# Patient Record
Sex: Female | Born: 1960 | Race: White | Hispanic: No | Marital: Single | Smoking: Never smoker
Health system: Southern US, Community
[De-identification: ages and names within clinical notes are randomized; demographics above are authoritative.]

## PROBLEM LIST (undated history)

## (undated) DIAGNOSIS — F419 Anxiety disorder, unspecified: Secondary | ICD-10-CM

---

## 2005-11-22 ENCOUNTER — Ambulatory Visit (HOSPITAL_COMMUNITY): Admission: RE | Admit: 2005-11-22 | Discharge: 2005-11-22 | Payer: Self-pay | Admitting: Obstetrics and Gynecology

## 2005-11-22 ENCOUNTER — Encounter (INDEPENDENT_AMBULATORY_CARE_PROVIDER_SITE_OTHER): Payer: Self-pay | Admitting: *Deleted

## 2006-10-12 ENCOUNTER — Ambulatory Visit: Payer: Self-pay | Admitting: Family Medicine

## 2010-09-23 NOTE — Op Note (Signed)
NAMENANETTA, WIEGMAN NO.:  [id number]   MEDICAL RECORD NO.:  [id number]          PATIENT TYPE:  AMB   LOCATION:  SDC                           FACILITY:  [facility]   PHYSICIAN:  Richardean Sale, M.D.   DATE OF BIRTH:  [birth year]   DATE OF PROCEDURE:  11/22/2005  DATE OF DISCHARGE:                                 OPERATIVE REPORT   PREOPERATIVE DIAGNOSES:  1.  Dysfunctional uterine bleeding.  2.  Suspected endometrial polyp on ultrasound.   POSTOP DIAGNOSES:  1.  Dysfunctional uterine bleeding.  2.  Endometrial polyp.   PROCEDURE:  Hysteroscopy dilation and curettage with polypectomy.   SURGEON:  Richardean Sale, M.D.   ASSISTANT:  None.   ANESTHESIA:  General.   COMPLICATIONS:  None.   ESTIMATED BLOOD LOSS:  Minimal.   FLUID DEFICIT:  10 mL.   FINDINGS:  Multiple small endometrial polyps originating from the anterior  uterine wall and the fundus.  Moderate amount of cervical stenosis.   SPECIMENS:  Endometrial polyps and curettings sent to pathology.   INDICATIONS:  This is a 45-year gravida 0 white female who has had  progressively heavier periods and some intermenstrual bleeding.  He  underwent ultrasound evaluation and was found to have a thickened  endometrium; and an area of the fundus on ultrasound which was suspicious  for a polyp.  A sonohistogram was attempted in the office, but was  unsuccessful due to cervical stenosis.  The patient, therefore, presents  today for a hysteroscopy D&C and possible removal of endometrial polyp.  Prior to the procedure the risks, benefits, and alternatives of the  procedure were reviewed with the patient in detail.  We discussed the risks  which include; but are not limited to, hemorrhage requiring transfusion,  infection, injury to the uterus such the perforation which might require  additional surgery, and possible result in hysterectomy.  Also reviewed  anesthesia related complications, and DVT.  The patient  voices understanding  of all the above and desires to proceed.  Informed consent has been  obtained.  The patient was also counseled on the option to proceed with  endometrial ablation at the time of this procedure to help control heavy  menses; and the patient has declined.   DESCRIPTION OF PROCEDURE:  The patient was taken to the operating room where  she was given a general anesthetic.  She was then prepped and draped in the  usual sterile fashion in the dorsal lithotomy position.  A red rubber  catheter was then used to drain the bladder.  A bimanual exam was performed.  The uterus was slightly anteverted, mobile with no obvious masses.  Adnexa  were normal.  A speculum was then placed in the vagina; and a single-tooth  tenaculum was used to grasp the edge of the cervix.  A paracervical block  using a total of 20 mL of 1% Dexamethasone was then administered to help  with postoperative discomfort.  The cervix which was moderately stenotic and  for which the patient had used intravaginal Cytotec the night before; was  then very gently dilated, the lacrimal duct dilators had to be used to enter  the uterine cavity.  Using very careful dilation the uterus was able to be  dilated up to the #27 Hegar dilator.   Once this was complete, the hysteroscope was then introduced.  Findings  revealed multiple endometrial polyps that were obscuring the tubal ostia.  No submucosal or intracavitary fibroids were identified.  The hysteroscope  was then removed.  Given the multiple small polyps, the polyp forceps were  used to remove these.  These were sent to pathology labeled as endometrial  polyp.  The hysteroscope was then reintroduced.  There are no further polyps  identified; and the cavity appeared normal.  The hysteroscope was then  removed in this was followed by a sharp curettage until a gritty texture was  noted in all four quadrants.  This specimen was then sent to pathology  labeled as  endometrial curettings.   At this point, the procedure was terminated.  All instruments were then  removed; and there was no bleeding coming from the tenaculum site; and there  was minimal bleeding coming from the cervix.  The patient tolerated the  procedure very well.  She was awakened from her anesthesia, taken out of  dorsal lithotomy position; and was transferred to the recovery room, awake  and in stable condition.  There are no complications.  All sponge, lap,  needle, and instrument counts were correct x2.      Richardean Sale, M.D.  Electronically Signed     JW/MEDQ  D:  11/22/2005  T:  11/22/2005  Job:  [id number]

## 2010-10-06 ENCOUNTER — Ambulatory Visit: Payer: Self-pay

## 2011-04-04 ENCOUNTER — Other Ambulatory Visit: Payer: Self-pay | Admitting: Dermatology

## 2011-09-13 ENCOUNTER — Ambulatory Visit: Payer: Self-pay | Admitting: *Deleted

## 2017-06-05 ENCOUNTER — Encounter (HOSPITAL_COMMUNITY): Payer: Self-pay | Admitting: Emergency Medicine

## 2017-06-05 ENCOUNTER — Emergency Department (HOSPITAL_COMMUNITY)
Admission: EM | Admit: 2017-06-05 | Discharge: 2017-06-06 | Disposition: A | Discharge status: Other Acute Inpatient Hospital | Payer: BLUE CROSS/BLUE SHIELD | Attending: Emergency Medicine | Admitting: Emergency Medicine

## 2017-06-05 DIAGNOSIS — F312 Bipolar disorder, current episode manic severe with psychotic features: Secondary | ICD-10-CM | POA: Diagnosis not present

## 2017-06-05 DIAGNOSIS — F22 Delusional disorders: Secondary | ICD-10-CM | POA: Insufficient documentation

## 2017-06-05 DIAGNOSIS — F309 Manic episode, unspecified: Secondary | ICD-10-CM | POA: Insufficient documentation

## 2017-06-05 DIAGNOSIS — Z79899 Other long term (current) drug therapy: Secondary | ICD-10-CM | POA: Diagnosis not present

## 2017-06-05 DIAGNOSIS — R45851 Suicidal ideations: Secondary | ICD-10-CM | POA: Insufficient documentation

## 2017-06-05 DIAGNOSIS — G4700 Insomnia, unspecified: Secondary | ICD-10-CM | POA: Diagnosis not present

## 2017-06-05 LAB — CBC WITH DIFFERENTIAL/PLATELET
BASOS PCT: 0 %
Basophils Absolute: 0 10*3/uL (ref 0.0–0.1)
EOS ABS: 0 10*3/uL (ref 0.0–0.7)
EOS PCT: 0 %
HCT: 42.4 % (ref 36.0–46.0)
Hemoglobin: 15.1 g/dL — ABNORMAL HIGH (ref 12.0–15.0)
LYMPHS ABS: 1.3 10*3/uL (ref 0.7–4.0)
Lymphocytes Relative: 17 %
MCH: 32.3 pg (ref 26.0–34.0)
MCHC: 35.6 g/dL (ref 30.0–36.0)
MCV: 90.8 fL (ref 78.0–100.0)
Monocytes Absolute: 0.6 10*3/uL (ref 0.1–1.0)
Monocytes Relative: 7 %
NEUTROS PCT: 76 %
Neutro Abs: 5.6 10*3/uL (ref 1.7–7.7)
PLATELETS: 254 10*3/uL (ref 150–400)
RBC: 4.67 MIL/uL (ref 3.87–5.11)
RDW: 12.4 % (ref 11.5–15.5)
WBC: 7.5 10*3/uL (ref 4.0–10.5)

## 2017-06-05 LAB — COMPREHENSIVE METABOLIC PANEL
ALBUMIN: 4.4 g/dL (ref 3.5–5.0)
ALT: 24 U/L (ref 14–54)
AST: 30 U/L (ref 15–41)
Alkaline Phosphatase: 49 U/L (ref 38–126)
Anion gap: 8 (ref 5–15)
BUN: 7 mg/dL (ref 6–20)
CHLORIDE: 104 mmol/L (ref 101–111)
CO2: 26 mmol/L (ref 22–32)
CREATININE: 0.66 mg/dL (ref 0.44–1.00)
Calcium: 9.5 mg/dL (ref 8.9–10.3)
GFR calc Af Amer: >60 mL/min (ref >60)
GFR calc non Af Amer: >60 mL/min (ref >60)
GLUCOSE: 127 mg/dL — AB (ref 65–99)
POTASSIUM: 3.7 mmol/L (ref 3.5–5.1)
SODIUM: 138 mmol/L (ref 135–145)
Total Bilirubin: 1.1 mg/dL (ref 0.3–1.2)
Total Protein: 7.7 g/dL (ref 6.5–8.1)

## 2017-06-05 LAB — I-STAT BETA HCG BLOOD, ED (MC, WL, AP ONLY): I-stat hCG, quantitative: 11.3 m[IU]/mL — ABNORMAL HIGH (ref <5)

## 2017-06-05 LAB — RAPID URINE DRUG SCREEN, HOSP PERFORMED
AMPHETAMINES: NOT DETECTED
BENZODIAZEPINES: NOT DETECTED
Barbiturates: NOT DETECTED
Cocaine: NOT DETECTED
Opiates: NOT DETECTED
TETRAHYDROCANNABINOL: NOT DETECTED

## 2017-06-05 LAB — ETHANOL: Alcohol, Ethyl (B): <10 mg/dL (ref <10)

## 2017-06-05 LAB — POC URINE PREG, ED: Preg Test, Ur: NEGATIVE

## 2017-06-05 MED ORDER — MAGNESIUM CHLORIDE 64 MG PO TBEC
1.0000 | DELAYED_RELEASE_TABLET | Freq: Two times a day (BID) | ORAL | Status: DC
  Administered 2017-06-05 – 2017-06-06 (×2): 64 mg via ORAL
  Filled 2017-06-05 (×3): qty 1

## 2017-06-05 MED ORDER — VITAMIN A 10000 UNITS PO CAPS
10000.0000 [IU] | ORAL_CAPSULE | Freq: Every day | ORAL | Status: DC
  Administered 2017-06-05 – 2017-06-06 (×2): 10000 [IU] via ORAL
  Filled 2017-06-05 (×2): qty 1

## 2017-06-05 MED ORDER — OLANZAPINE 2.5 MG PO TABS
2.5000 mg | ORAL_TABLET | Freq: Every day | ORAL | Status: DC
  Administered 2017-06-05: 2.5 mg via ORAL
  Filled 2017-06-05: qty 1

## 2017-06-05 MED ORDER — BENZONATATE 100 MG PO CAPS
100.0000 mg | ORAL_CAPSULE | Freq: Once | ORAL | Status: AC
  Administered 2017-06-05: 100 mg via ORAL
  Filled 2017-06-05: qty 1

## 2017-06-05 NOTE — ED Notes (Signed)
Bed: WTR5 Expected date:  Expected time:  Means of arrival:  Comments: 
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
## 2017-06-05 NOTE — ED Notes (Signed)
Pt A&O x 3, no distress noted, calm & cooperative.  Visiting with family at present.  Monitoring for safety, Q 15 min checks in effect.

## 2017-06-05 NOTE — ED Notes (Signed)
IVC paperwork states," Pt paranoid, states she's "being set up." Carrying illegal firearms per S.O. Family found knife in belongings. Pt has made statements she "wants to hang herself." Pt having rapid, pressured speech. Inpt treatment recommended.

## 2017-06-05 NOTE — ED Notes (Signed)
[facility] at this time. Patient calm/ cooperative at this time. IVC paperwork with Diplomatic Services operational officersecretary. Plan of care reviewed with patient and patient's significant other. Will continue to monitor patient until patient can be placed in psychiatric area.
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
## 2017-06-05 NOTE — ED Triage Notes (Signed)
EDP at bedside. Pt ambulated to bathroom with supervision. No balance issues noted. Pt cooperative

## 2017-06-05 NOTE — ED Notes (Signed)
Bed: [facility] Expected date:  Expected time:  Means of arrival:  Comments: 

## 2017-06-05 NOTE — ED Triage Notes (Signed)
Per pt, states she has bouts of emotional stress/issues-states she saw a [place] in [place] a few weeks ago-was placed on Zyprexa-states she saw her again yesterday and was told she wasn't paranoid or delusional-patient is hyper verbal, speech pressured-mother is with her-states she is not suicidal or homicidal-states she needs to book a flight and she is ok

## 2017-06-05 NOTE — ED Notes (Signed)
Report given to NIGHT RN
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
## 2017-06-05 NOTE — ED Notes (Signed)
Patient provided with oral fluids and meal. No other acute needs identified.

## 2017-06-06 ENCOUNTER — Other Ambulatory Visit: Payer: Self-pay

## 2017-06-06 ENCOUNTER — Encounter (HOSPITAL_COMMUNITY): Payer: Self-pay | Admitting: *Deleted

## 2017-06-06 ENCOUNTER — Inpatient Hospital Stay (HOSPITAL_COMMUNITY)
Admission: AD | Admit: 2017-06-06 | Discharge: 2017-06-08 | DRG: 885 | Disposition: A | Discharge status: Home / Self Care | Payer: BLUE CROSS/BLUE SHIELD | Attending: Psychiatry | Admitting: Psychiatry

## 2017-06-06 DIAGNOSIS — F312 Bipolar disorder, current episode manic severe with psychotic features: Principal | ICD-10-CM | POA: Diagnosis present

## 2017-06-06 DIAGNOSIS — Z79899 Other long term (current) drug therapy: Secondary | ICD-10-CM

## 2017-06-06 DIAGNOSIS — F4310 Post-traumatic stress disorder, unspecified: Secondary | ICD-10-CM | POA: Diagnosis present

## 2017-06-06 DIAGNOSIS — R45851 Suicidal ideations: Secondary | ICD-10-CM | POA: Diagnosis not present

## 2017-06-06 DIAGNOSIS — R450 Nervousness: Secondary | ICD-10-CM | POA: Diagnosis not present

## 2017-06-06 DIAGNOSIS — Z6379 Other stressful life events affecting family and household: Secondary | ICD-10-CM | POA: Diagnosis not present

## 2017-06-06 DIAGNOSIS — Z818 Family history of other mental and behavioral disorders: Secondary | ICD-10-CM | POA: Diagnosis not present

## 2017-06-06 DIAGNOSIS — F419 Anxiety disorder, unspecified: Secondary | ICD-10-CM | POA: Diagnosis not present

## 2017-06-06 DIAGNOSIS — Z811 Family history of alcohol abuse and dependence: Secondary | ICD-10-CM | POA: Diagnosis not present

## 2017-06-06 DIAGNOSIS — G4700 Insomnia, unspecified: Secondary | ICD-10-CM | POA: Diagnosis present

## 2017-06-06 DIAGNOSIS — F1910 Other psychoactive substance abuse, uncomplicated: Secondary | ICD-10-CM | POA: Diagnosis not present

## 2017-06-06 DIAGNOSIS — F22 Delusional disorders: Secondary | ICD-10-CM | POA: Diagnosis not present

## 2017-06-06 HISTORY — DX: Anxiety disorder, unspecified: F41.9

## 2017-06-06 MED ORDER — ALUM & MAG HYDROXIDE-SIMETH 200-200-20 MG/5ML PO SUSP: 30.0000 mL | ORAL | Status: DC | PRN

## 2017-06-06 MED ORDER — TRAZODONE HCL 50 MG PO TABS
50.0000 mg | ORAL_TABLET | Freq: Every evening | ORAL | Status: DC | PRN
  Filled 2017-06-06: qty 1

## 2017-06-06 MED ORDER — OLANZAPINE 5 MG PO TABS: 5.0000 mg | ORAL_TABLET | Freq: Every day | ORAL | Status: DC

## 2017-06-06 MED ORDER — MAGNESIUM HYDROXIDE 400 MG/5ML PO SUSP
30.0000 mL | Freq: Every day | ORAL | Status: DC | PRN
Start: 2017-06-06 — End: 2017-06-08

## 2017-06-06 MED ORDER — OLANZAPINE 5 MG PO TABS
5.0000 mg | ORAL_TABLET | Freq: Every day | ORAL | Status: DC
  Administered 2017-06-06 – 2017-06-07 (×2): 5 mg via ORAL
  Filled 2017-06-06: qty 2
  Filled 2017-06-06 (×4): qty 1

## 2017-06-06 MED ORDER — ACETAMINOPHEN 325 MG PO TABS: 650.0000 mg | ORAL_TABLET | Freq: Four times a day (QID) | ORAL | Status: DC | PRN

## 2017-06-06 MED ORDER — VITAMIN A 10000 UNITS PO CAPS
10000.0000 [IU] | ORAL_CAPSULE | Freq: Every day | ORAL | Status: DC
  Administered 2017-06-07 – 2017-06-08 (×2): 10000 [IU] via ORAL
  Filled 2017-06-06 (×4): qty 1

## 2017-06-06 MED ORDER — HYDROXYZINE HCL 25 MG PO TABS
25.0000 mg | ORAL_TABLET | Freq: Three times a day (TID) | ORAL | Status: DC | PRN
  Administered 2017-06-07: 25 mg via ORAL
  Filled 2017-06-06: qty 1

## 2017-06-06 NOTE — Plan of Care (Signed)
  Safety: Periods of time without injury will increase 06/06/2017 2324 - Progressing by Delos HaringPhillips, Gracey Tolle A, RN Note Pt safe on the unit at this time

## 2017-06-06 NOTE — Tx Team (Signed)
Initial Treatment Plan 06/06/2017 6:55 PM Suzanne Frye ZOX:[id number]    PATIENT STRESSORS: Medication change or noncompliance Occupational concerns   PATIENT STRENGTHS: Ability for insight Average or above average intelligence Capable of independent living Communication skills General fund of knowledge Motivation for treatment/growth Supportive family/friends   PATIENT IDENTIFIED PROBLEMS: Anxiety Coping skills "I've been having a lot of anxiety"                     DISCHARGE CRITERIA:  Ability to meet basic life and health needs Improved stabilization in mood, thinking, and/or behavior Verbal commitment to aftercare and medication compliance  PRELIMINARY DISCHARGE PLAN: Attend aftercare/continuing care group Return to previous living arrangement  PATIENT/FAMILY INVOLVEMENT: This treatment plan has been presented to and reviewed with the patient, Suzanne Frye, and/or family member, .  The patient and family have been given the opportunity to ask questions and make suggestions.  Suzanne Frye, Suzanne Frye, [place]RN 06/06/2017, 6:55 PM

## 2017-06-06 NOTE — ED Notes (Addendum)
Pt transported to [facility] by [employer]. All belongings returned to pt who signed for same. Pt remained calm and cooperative.  

## 2017-06-06 NOTE — Progress Notes (Signed)
D: Pt denies SI/HI/AVH. Pt is pleasant and cooperative. Pt presents hyper- verbal. Pt stated she got here due to anxiety and job stress. Pt seemed to have some insight into Tx, but may have had a breakdown in her coping mechanisms. Pt has been visible on the unit and has been appropriate with peers/ staff.   A: Pt was offered support and encouragement. Pt was given scheduled medications. Pt was encourage to attend groups. Q 15 minute checks were done for safety.   R:Pt attends groups and interacts well with peers and staff. Pt is taking medication. Pt has no complaints.Pt receptive to treatment and safety maintained on unit.

## 2017-06-06 NOTE — BH Assessment (Addendum)
[facility]  Per Juanetta BeetsJacqueline Norman, DO, this pt requires psychiatric hospitalization.  Malva LimesLinsey Strader, RN, [facility] in anticipation of a discharge scheduled for later today; she will call when [facility].  Pt presents under IVC initiated by EDP Samuel JesterKathleen McManus, MD,  and [facility] documents [facility].  Pt's nurse, Diane, has been notified, and agrees to call report to [phone].  Pt is to be transported via Patent examinerlaw enforcement when the time comes.   Doylene Canninghomas Abcde Oneil, [place] Behavioral Health Coordinator [phone]   Addendum:  Per Richelle ItoLinsey, [facility] pt at 16:00.  Diane has been notified.  Doylene Canninghomas Chaston Bradburn, [place] Behavioral Health Coordinator [phone]

## 2017-06-06 NOTE — Consult Note (Signed)
[facility] Face-to-Face Psychiatry Consult   Reason for Consult:  SI and manic symptoms Referring Physician:  EDP Patient Identification: Suzanne Frye MRN:  [id number] Principal Diagnosis: Bipolar I disorder, current or most recent episode manic, with psychotic features ([place]) Diagnosis:  There are no active problems to display for this patient.   Total Time spent with patient: 45 minutes  Subjective:   Suzanne Frye is a 57 y.o. female patient admitted with manic symptoms and SI.  HPI:   [name] reports that she came to the ED to get some medication to help slow her down but she is supposed to be in [place] for a business trip. She denies SI and "swears on the bible" which she has been reading today. She reports that last summer she felt paranoid due to a specific job she was completing. She thought the phone was tapped and someone was stalking her. She attributes some of these feelings to dealing with "trauma" of feeling pushed out of her church. She reports going target shooting for protection because she felt someone had broken into her house. She reports "mouthing off" at her mother that she should shoot herself. She denies intention to harm self and reports saying this in the setting of Xanax and alcohol use. She took her friend's Xanax for 4 days to help with sleep. She reports that her outpatient psychiatrist has been adjusting her medications. She was recently taking Risperdal but it was switched to Zyprexa a week ago. She feels like Risperdal caused racing thoughts. She continues to have poor sleep.   Past Psychiatric History: Bipolar disorder  Risk to Self: Suicidal Ideation: No-Not Currently/Within Last 6 Months Suicidal Intent: No-Not Currently/Within Last 6 Months Is patient at risk for suicide?: No Suicidal Plan?: No-Not Currently/Within Last 6 Months Access to Means: Yes Specify Access to Suicidal Means: pt had a gun and a knife What has been your use of drugs/alcohol within the  last 12 months?: using acohol and xanex How many times?: 0 Intentional Self Injurious Behavior: None Risk to Others: Homicidal Ideation: No Thoughts of Harm to Others: No Current Homicidal Intent: No Current Homicidal Plan: No Access to Homicidal Means: No Identified Victim: None History of harm to others?: No Assessment of Violence: None Noted Violent Behavior Description: none Does patient have access to weapons?: No Criminal Charges Pending?: No Does patient have a court date: No Prior Inpatient Therapy: Prior Inpatient Therapy: No Prior Outpatient Therapy: Prior Outpatient Therapy: Yes Prior Therapy Facilty/Provider(s): Dr. Tera Mater  Reason for Treatment: med management  Does patient have an ACCT team?: No Does patient have Intensive In-House Services?  : No Does patient have [facility] services? : No Does patient have P4CC services?: No  Past Medical History: History reviewed. No pertinent past medical history. The histories are not reviewed yet. Please review them in the "History" navigator section and refresh this [place]. Family History: No family history on file. Family Psychiatric  History: She reports that her mother had a "mental breakdown" when the patient's uncle passed.  Social History:  Social History   Substance and Sexual Activity  Alcohol Use Yes     Social History   Substance and Sexual Activity  Drug Use Not on file    Social History   Socioeconomic History  . Marital status: Single    Spouse name: None  . Number of children: None  . Years of education: None  . Highest education level: None  Social Needs  . Financial resource strain:  None  . Food insecurity - worry: None  . Food insecurity - inability: None  . Transportation needs - medical: None  . Transportation needs - non-medical: None  Occupational History  . None  Tobacco Use  . Smoking status: None  Substance and Sexual Activity  . Alcohol use: Yes  . Drug use: None  . Sexual  activity: None  Other Topics Concern  . None  Social History Narrative  . None   Additional Social History: She has a house in [place] with her partner but she stays with her mom in [place] as well. 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 She reports social alcohol use. She denies illicit substance use but does reports taking her friend's Xanax for 4 days to help with sleep.     Allergies:  Not on File  Labs:  Results for orders placed or performed during the hospital encounter of 06/05/17 (from the past 48 hour(s))  Comprehensive metabolic panel     Status: Abnormal   Collection Time: 06/05/17 10:47 AM  Result Value Ref Range   Sodium 138 135 - 145 mmol/L   Potassium 3.7 3.5 - 5.1 mmol/L   Chloride 104 101 - 111 mmol/L   CO2 26 22 - 32 mmol/L   Glucose, Bld 127 (H) 65 - 99 mg/dL   BUN 7 6 - 20 mg/dL   Creatinine, Ser 0.66 0.44 - 1.00 mg/dL   Calcium 9.5 8.9 - 10.3 mg/dL   Total Protein 7.7 6.5 - 8.1 g/dL   Albumin 4.4 3.5 - 5.0 g/dL   AST 30 15 - 41 U/L   ALT 24 14 - 54 U/L   Alkaline Phosphatase 49 38 - 126 U/L   Total Bilirubin 1.1 0.3 - 1.2 mg/dL   GFR calc non Af Amer >60 >60 mL/min   GFR calc Af Amer >60 >60 mL/min    Comment: (NOTE) The eGFR has been calculated using the CKD EPI equation. This calculation has not been validated in all clinical situations. eGFR's persistently <60 mL/min signify possible Chronic Kidney Disease.    Anion gap 8 5 - 15  CBC with Diff     Status: Abnormal   Collection Time: 06/05/17 10:47 AM  Result Value Ref Range   WBC 7.5 4.0 - 10.5 K/uL   RBC 4.67 3.87 - 5.11 MIL/uL   Hemoglobin 15.1 (H) 12.0 - 15.0 g/dL   HCT 42.4 36.0 - 46.0 %   MCV 90.8 78.0 - 100.0 fL   MCH 32.3 26.0 - 34.0 pg   MCHC 35.6 30.0 - 36.0 g/dL   RDW 12.4 11.5 - 15.5 %   Platelets 254 150 - 400 K/uL   Neutrophils Relative % 76 %   Neutro Abs 5.6 1.7 - 7.7 K/uL   Lymphocytes Relative 17 %   Lymphs Abs 1.3 0.7 - 4.0 K/uL   Monocytes  Relative 7 %   Monocytes Absolute 0.6 0.1 - 1.0 K/uL   Eosinophils Relative 0 %   Eosinophils Absolute 0.0 0.0 - 0.7 K/uL   Basophils Relative 0 %   Basophils Absolute 0.0 0.0 - 0.1 K/uL  Ethanol     Status: None   Collection Time: 06/05/17 10:48 AM  Result Value Ref Range   Alcohol, Ethyl (B) <10 <10 mg/dL    Comment:        LOWEST DETECTABLE LIMIT FOR SERUM ALCOHOL IS 10 mg/dL FOR MEDICAL PURPOSES ONLY   I-Stat beta hCG blood, ED  Status: Abnormal   Collection Time: 06/05/17 11:01 AM  Result Value Ref Range   I-stat hCG, quantitative 11.3 (H) <5 mIU/mL   Comment 3            Comment:   GEST. AGE      CONC.  (mIU/mL)   <=1 WEEK        5 - 50     2 WEEKS       50 - 500     3 WEEKS       100 - 10,000     4 WEEKS     1,000 - 30,000        FEMALE AND NON-PREGNANT FEMALE:     LESS THAN 5 mIU/mL   Urine rapid drug screen (hosp performed)     Status: None   Collection Time: 06/05/17 11:26 AM  Result Value Ref Range   Opiates NONE DETECTED NONE DETECTED   Cocaine NONE DETECTED NONE DETECTED   Benzodiazepines NONE DETECTED NONE DETECTED   Amphetamines NONE DETECTED NONE DETECTED   Tetrahydrocannabinol NONE DETECTED NONE DETECTED   Barbiturates NONE DETECTED NONE DETECTED    Comment: (NOTE) DRUG SCREEN FOR MEDICAL PURPOSES ONLY.  IF CONFIRMATION IS NEEDED FOR ANY PURPOSE, NOTIFY LAB WITHIN 5 DAYS. LOWEST DETECTABLE LIMITS FOR URINE DRUG SCREEN Drug Class                     Cutoff (ng/mL) Amphetamine and metabolites    1000 Barbiturate and metabolites    200 Benzodiazepine                 588 Tricyclics and metabolites     300 Opiates and metabolites        300 Cocaine and metabolites        300 THC                            50   POC Urine Pregnancy, ED (do NOT order at [facility])     Status: None   Collection Time: 06/05/17 11:34 AM  Result Value Ref Range   Preg Test, Ur NEGATIVE NEGATIVE    Comment:        THE SENSITIVITY OF THIS METHODOLOGY IS >24 mIU/mL     No  current facility-administered medications for this encounter.    No current outpatient medications on file.    Musculoskeletal: Strength & Muscle Tone: within normal limits Gait & Station: normal Patient leans: N/A  Psychiatric Specialty Exam: Physical Exam  Nursing note and vitals reviewed. Constitutional: She is oriented to person, place, and time. She appears well-developed and well-nourished.  HENT:  Head: Normocephalic and atraumatic.  Neck: Normal range of motion.  Respiratory: Effort normal.  Musculoskeletal: Normal range of motion.  Neurological: She is alert and oriented to person, place, and time.  Skin: No rash noted.  Psychiatric: Her affect is labile. Her speech is rapid and/or pressured. She is agitated. Thought content is paranoid and delusional. Cognition and memory are normal. She expresses impulsivity.    Review of Systems  Psychiatric/Behavioral: Negative for depression, hallucinations, substance abuse and suicidal ideas. The patient has insomnia. The patient is not nervous/anxious.   All other systems reviewed and are negative.   Blood pressure 131/70, pulse 93, temperature 99.1 F (37.3 C), temperature source Oral, resp. rate 17, height '5\' 3"'$  (1.6 m), weight 65.8 kg (145 lb), SpO2 99 %.Body mass index is 25.69  kg/m.  General Appearance: Well Groomed, middle aged, Caucasian female who is wearing paper hospital scrubs and sitting in her chair while rocking back and forth. NAD.   Eye Contact:  Good  Speech:  Clear and Coherent with rapid speech.   Volume:  Normal  Mood:  Euphoric  Affect:  Labile  Thought Process:  Goal Directed and Linear  Orientation:  Full (Time, Place, and Person)  Thought Content:  Delusions and Paranoid Ideation  Suicidal Thoughts:  No  Homicidal Thoughts:  No  Memory:  Immediate;   Good Recent;   Good Remote;   Good  Judgement:  Poor  Insight:  Fair  Psychomotor Activity:  Increased and noted to pace hallway and rock back and  forth in her chair.   Concentration:  Concentration: Good and Attention Span: Good  Recall:  Good  Fund of Knowledge:  Fair  Language:  Good  Akathisia:  No  Handed:  Right  AIMS (if indicated):   N/A  Assets:  Agricultural consultant Housing Intimacy Social Support  ADL's:  Intact  Cognition:  WNL  Sleep:   Poor   Assessment:  CHERIDAN KIBLER is a 57 y.o. female who was admitted due to concerns of her family and partner for manic symptoms and unsafe behaviors and concern for SI. She is labile in affect and endorses delusional as well as paranoid thoughts on interview. Her speech is rapid and she reports poor sleep for several nights. Family report that she made statements about wanting to end her life and engaged in dangerous behaviors (target shooting with a gun as well as endorsing thoughts to hang self. She warrants inpatient psychiatric hospitalization for stabilization and treatment.   Treatment Plan Summary: Daily contact with patient to assess and evaluate symptoms and progress in treatment and Medication management  -Increase Zyprexa 2.5 mg qhs to 5 mg qhs for manic symptoms and insomnia.  -Patient accepted to [facility] today.   Disposition: Recommend psychiatric Inpatient admission when medically cleared.  Faythe Dingwall, DO 06/06/2017 6:05 PM

## 2017-06-06 NOTE — Progress Notes (Signed)
Suzanne Frye is a 57 year old female pt admitted on involuntary basis. On admission, she does endorse anxiety but denies any SI and is able to contract for safety while in the hospital. She reports having some occupational concerns and spoke about how she should have been able to handle them but hasn't been able to do so. She spoke about being on a small dose of medication that she was put on and then spoke about how she did take some xanax and used alcohol for 4 days to help alleviate her anxiety. 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 She reports that she lives with her partner and reports that she will go back there upon discharge. Suzanne Frye was oriented to the unit and safety maintained.

## 2017-06-07 DIAGNOSIS — F419 Anxiety disorder, unspecified: Secondary | ICD-10-CM

## 2017-06-07 DIAGNOSIS — F312 Bipolar disorder, current episode manic severe with psychotic features: Principal | ICD-10-CM

## 2017-06-07 DIAGNOSIS — R450 Nervousness: Secondary | ICD-10-CM

## 2017-06-07 DIAGNOSIS — Z811 Family history of alcohol abuse and dependence: Secondary | ICD-10-CM

## 2017-06-07 DIAGNOSIS — R45851 Suicidal ideations: Secondary | ICD-10-CM

## 2017-06-07 DIAGNOSIS — Z6379 Other stressful life events affecting family and household: Secondary | ICD-10-CM

## 2017-06-07 DIAGNOSIS — Z818 Family history of other mental and behavioral disorders: Secondary | ICD-10-CM

## 2017-06-07 DIAGNOSIS — F1910 Other psychoactive substance abuse, uncomplicated: Secondary | ICD-10-CM

## 2017-06-07 DIAGNOSIS — F4310 Post-traumatic stress disorder, unspecified: Secondary | ICD-10-CM

## 2017-06-07 MED ORDER — IBUPROFEN 600 MG PO TABS
600.0000 mg | ORAL_TABLET | Freq: Four times a day (QID) | ORAL | Status: DC | PRN
  Administered 2017-06-07 (×2): 600 mg via ORAL
  Filled 2017-06-07 (×2): qty 1

## 2017-06-07 NOTE — Progress Notes (Addendum)
D: Pt denies SI/HI/AVh. Pt is pleasant and cooperative. Pt stated she had a great day, "I want to talk to the doctor , there's a lot I haven't told him, I wake up with a lot of anxiety" pt was encouraged to use the Vistaril due to it being prescribed for the anxiety.   A: Pt was offered support and encouragement. Pt was given scheduled medications. Pt was encourage to attend groups. Q 15 minute checks were done for safety.   R:Pt attends groups and interacts well with peers and staff. Pt is taking medication. Pt has no complaints.Pt receptive to treatment and safety maintained on unit.

## 2017-06-07 NOTE — [facility] Suicide Risk Assessment (Signed)
[facility] INPATIENT:  Family/Significant Other Suicide Prevention Education  Suicide Prevention Education:  Education Completed; Suzanne Frye [phone] (Mother)  has been identified by the patient as the family member/significant other with whom the patient will be residing, and identified as the person(s) who will aid the patient in the event of a mental health crisis (suicidal ideations/suicide attempt).  With written consent from the patient, the family member/significant other has been provided the following suicide prevention education, prior to the and/or following the discharge of the patient.  The suicide prevention education provided includes the following:  Suicide risk factors  Suicide prevention and interventions  National Suicide Hotline telephone number  [facility] assessment telephone number  [facility] Emergency Assistance 911  [facility] and/or Residential Mobile Crisis Unit telephone number  Request made of family/significant other to:  Remove weapons (e.g., guns, rifles, knives), all items previously/currently identified as safety concern.    Remove drugs/medications (over-the-counter, prescriptions, illicit drugs), all items previously/currently identified as a safety concern.  The family member/significant other verbalizes understanding of the suicide prevention education information provided.  The family member/significant other agrees to remove the items of safety concern listed above.   Suzanne Frye states that this behavior began in June of 2018 with paranoia.  The behavior decreased until January of 2019 when the family discovered Suzanne Frye was taking Xanax and her psychiatric medications with wine.  It was stated that at this time Suzanne Frye began expressing passive SI and had very little sleep during a two week period.  The family brought her to the hospital in hopes that she will stop drinking while taking her medications.  Her mother does not feel  like she needs to be on 500 hall and would like to see her released soon.   Suzanne Frye 06/07/2017, 9:47 AM

## 2017-06-07 NOTE — Plan of Care (Signed)
  Safety: Periods of time without injury will increase 06/07/2017 2208 - Progressing by Delos HaringPhillips, Dijuan Sleeth A, RN Note Pt safe on the unit at this time    Self-Concept: Level of anxiety will decrease 06/07/2017 2208 - Progressing by Delos HaringPhillips, Brailynn Breth A, RN Note Pt stated she was less anxious and pt appeared less anxious compared to last night

## 2017-06-07 NOTE — Progress Notes (Signed)
pt stated the 5 mg of Zyprexa was too much, she probably would want to split it up

## 2017-06-07 NOTE — Progress Notes (Signed)
[facility] Group Notes:  (Nursing/MHT/Case Management/Adjunct)  Date:  06/07/2017  Time:  4:35 PM  Type of Therapy:  Nurse Education  Participation Level:  Active  Participation Quality:  Appropriate  Affect:  Anxious  Cognitive:  Alert and Oriented  Insight:  Appropriate  Engagement in Group:  Engaged  Modes of Intervention:  Activity, Discussion, Education, Socialization and Support  Summary of Progress/Problems:The purpose of this group is to use guided imagery and support patients in discussing coping skills for relaxation.   Beatrix ShipperWright, Kambra Beachem Martin 06/07/2017, 4:35 PM

## 2017-06-07 NOTE — Progress Notes (Signed)
Recreation Therapy Notes  Date: 06/07/17 Time: 1000 Location: 500 Hall Dayroom  Group Topic: Self-Esteem  Goal Area(s) Addresses:  Patient will successfully identify positive attributes about themselves.  Patient will successfully identify benefit of improved self-esteem.   Behavioral Response: Engaged  Intervention: Scientist, clinical (histocompatibility and immunogenetics)Construction paper, markers, colored pencils  Activity: Brochure About Me:  Patients were to create a brochure to highlight the things that make them unique and set them apart from everyone else.  Patients could highlight things such as biggest accomplishment, favorite feature, important dates, best quality, etc.  Education:  Self-Esteem, Building control surveyorDischarge Planning.   Education Outcome: Acknowledges education/In group clarification offered/Needs additional education  Clinical Observations/Feedback: Pt was pleasant, appropriate but talked really fast.  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  Pt also stated she was a daughter, sister and "doggie mom".  Pt expressed that she was spiritual, devoted and happy.   Caroll RancherMarjette Odelle Kosier, LRT/CTRS      Caroll RancherLindsay, Berlie Hatchel A 06/07/2017 12:26 PM

## 2017-06-07 NOTE — B[facility] Counselor (Signed)
Adult Comprehensive Assessment  Patient ID: Suzanne Frye, female   DOB: 09/06/1960, 57 y.o.   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 and with her mother and [place] in [place] during the week  Physical health (include injuries & life threatening diseases): N/A Social relationships: N/A Substance abuse: Pt reports drinking 2 glasses of wine 5 days a week  Bereavement / Loss: N/A  Living/Environment/Situation:  Living Arrangements: Spouse/significant other, Parent Living conditions (as described by patient or guardian): Pt live with her partner on the weekends and with her parents during the week  How long has patient lived in current situation?: 12 years  What is atmosphere in current home: Comfortable, ParamedicLoving  Family History:  Marital status: Long term relationship Long term relationship, how long?: 20 years  What types of issues is patient dealing with in the relationship?: N/A Are you sexually active?: Yes What is your sexual orientation?: Homosexual  Does patient have children?: No  Childhood History:  By whom was/is the patient raised?: Both parents Description of patient's relationship with caregiver when they were a child: "It was well" Patient's description of current relationship with people who raised him/her: "It's still good"  Does patient have siblings?: Yes Number of Siblings: 3 Description of patient's current relationship with siblings: "Good" Did patient suffer any verbal/emotional/physical/sexual abuse as a child?: No Did patient suffer from severe childhood neglect?: No Has patient ever been sexually  abused/assaulted/raped as an adolescent or adult?: No Was the patient ever a victim of a crime or a disaster?: No Witnessed domestic violence?: No Has patient been effected by domestic violence as an adult?: No  Education:  Highest grade of school patient has completed: Masters degree from [employer] in Education  Currently a Consulting civil engineerstudent?: No Learning disability?: No  Employment/Work Situation:   Employment situation: Employed 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) How long has patient been employed?: 12 years  Patient's job has been impacted by current illness: No What is the longest time patient has a held a job?: 15 years  Where was the patient employed at that time?: [employer]  Has patient ever been in the Eli Lilly and Companymilitary?: No Has patient ever served in combat?: No Did You Receive Any Psychiatric Treatment/Services While in Equities traderthe Military?: No Are There Guns or Other Weapons in Your Home?: No Are These Weapons Safely Secured?: Yes  Financial Resources:   Financial resources: Income from employment, Private insurance Does patient have a representative payee or guardian?: No  Alcohol/Substance Abuse:   What has been your use of drugs/alcohol within the last 12 months?: Pt reports drinking 2 glasses of wine 5 days a week  If attempted suicide, did drugs/alcohol play a role in this?: No Alcohol/Substance Abuse Treatment Hx: Denies past history Has alcohol/substance abuse ever caused legal problems?: No  Social Support System:   Patient's Community Support System: Good Describe Community Support System: Parents, partner, best friend  Type of faith/religion: Universalist/Christian  How does patient's faith help to cope with current illness?: "I go to church sometimes"  Leisure/Recreation:   Leisure and Hobbies: Singing, Solicitorsong writing, hiking, exercise  Strengths/Needs:   What things does the patient do well?: Singing  In what areas does patient struggle /  problems for patient: "Detatching from other peoples problems"  Discharge Plan:   Does patient have access to transportation?: Yes Will patient be returning to same living situation after discharge?: Yes Currently receiving community mental health services: Yes (From Whom)([facility], Madelaine Etienne ) If no, would patient like referral for services when discharged?: No Does patient have financial barriers related to discharge medications?: No  Summary/Recommendations:   Summary and Recommendations (to be completed by the evaluator): Suzanne Frye is a 57 year old Caucasion female who has been diagnosed with Bipolar 1 Disorder current episode manic severe.  She presents with passive SI and anxiety.  She states that she does not want to harm herself and may have said things while she was taking Xanax with wine over the last several days.  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  She is receiving outpatient services at [facility] and will follow up with Dr. Madelaine Etienne.  Upon discharge she will return home with her partner and will resume staying with her parents during the week.  While in the hospital she can benefit from crisis stabilization, medication management, therapeutic milieu, and a referral for services.    Suzanne Frye. 06/07/2017

## 2017-06-07 NOTE — Progress Notes (Addendum)
Recreation Therapy Notes  INPATIENT RECREATION THERAPY ASSESSMENT  Patient Details Name: Franciso Bendlaine R Tomei MRN: [id number] DOB: 07/25/1960 Today's Date: 06/07/2017       Information Obtained From: Patient  Able to Participate in Assessment/Interview: Yes  Patient Presentation: Alert, Groomed  Reason for Admission (Per Patient): Other (Comments)("Doesn't really know")  Pt stated her mother and partner told her she stated she wanted to hang herself.  Patient Stressors: Work, Other (Comment)("Healing from a church experience")  Coping Skills:   Journal, Write, Music, Exercise, Meditate, Deep Breathing, Talk, Prayer, Read, Intrusive Behavior, Hot Bath/Shower  Leisure Interests (2+):  Music - Write music, Music - Singing, Individual - Other (Comment)(Meditate)  Frequency of Recreation/Participation: Weekly  Awareness of Community Resources:  Yes  Community Resources:  [employer]  Current Use: Yes  Expressed Interest in [employer] Resource Information: No  Patient Main Form of Transportation: Set designerCar  Patient Strengths:  Friendly; Coaching/Teaching  Patient Identified Areas of Improvement:  Healthy detachment  Current Recreation Participation:  Daily  Patient Goal for Hospitalization:  "To let go of somethings"  [place]  County of Residence:  [place]  Current [place] (including self-harm):  No  Current HI:  No  Current AVH: No  Staff Intervention Plan: Group Attendance  Consent to Intern Participation: N/A    Caroll RancherMarjette Joyce Heitman, LRT/CTRS  Lillia AbedLindsay, Montey Ebel A 06/07/2017, 1:15 PM

## 2017-06-07 NOTE — H&P (Signed)
Psychiatric Admission Assessment Adult  Patient Identification: Suzanne Frye MRN:  [id number] Date of Evaluation:  06/07/2017 Chief Complaint:  BIPOLAR DISORDER CURRENT EPISODE MANIC Principal Diagnosis: PTSD (post-traumatic stress disorder) Diagnosis:   Patient Active Problem List   Diagnosis Date Noted  . PTSD (post-traumatic stress disorder) [F43.10] 06/07/2017  . Paranoid delusion (HCC) [F22] 06/06/2017  . Bipolar I disorder, current or most recent episode manic, with psychotic features (HCC) [F31.2]    History of Present Illness:   Suzanne Frye is a 57 y/o F with no formal psychiatric history who was admitted on IVC placed in ED after she self-presented to ED with report of worsening symptoms of anxiety. During her presentation, pt appeared pressured, labile, and disorganized. She had made statements that she was recently paranoid regarding co-workers, and that she has been monitored via her phone. She also reported going to a gun range with a friend recently and she had made vague suicidal statement to her mother. As pt was requesting to be discharged last night, she was placed on IVC with concern for worsening manic symptoms, and she was transferred to [facility] for additional treatment and evaluation.  Upon initial presentation, pt is pleasant, bright, and cooperative. Her speech is rapid but she is able to be interrupted, and she does not appear pressured. 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 Pt also reports that at work she  felt triangulated between coworkers that were undergoing a legal proceeding. She notes that she felt overwhelmed with stress and began to feel that others were mocking her and eventually she felt that she was being monitored via her phone in some kind of relation to the legal case. She noticed other strange occurrences such as "fake texts." Pt denies ever experiencing SI/HI/AH/VH. She noticed some increased energy and decreased need for sleep, but only in the context of trial of risperdal in the last 2 weeks under direction of her psychiatrist. She endorses manic symptoms of increased activities and some thoughtlessness characterized by increased spending. She reports poor sleep of about 5 hours per night until she used a friend's supply of xanax for the past 4 nights prior to coming to the hospital. She characterizes her experience at her church as "trauma" but she denies symptoms of PTSD. She denies all illicit substance use aside from "2 glasses of wine" after work, which is an increase for her from rare use of alcohol.   Pt reports improving insight about her symptoms, and states, "I think some of my concerns may have been blown out of proportion." She notes that she was started on zyprexa 2.5mg  BID by her outpatient provider, and she felt like it was helping, but not with her sleep. Upon arrival last night, she received zyprexa 5mg  at bedtime, and she states that she slept much better. Pt is in agreement to continue zyprexa 5mg  po qhs. She would like to be discharged as soon as possible, and we discussed possible discharge as early as tomorrow if  she has continued symptom stability on the inpatient unit. Pt was in agreement with the above plan, and she had no further questions, comments, or concerns.   Associated Signs/Symptoms: Depression Symptoms:  depressed mood, anxiety, disturbed sleep, (Hypo) Manic Symptoms:  Delusions, Elevated Mood, Flight of Ideas, Immunologistinancial  Extravagance, Impulsivity, Labiality of Mood, Anxiety Symptoms:  Excessive Worry, Psychotic Symptoms:  Delusions, Ideas of Reference, Paranoia, PTSD Symptoms: Had a traumatic exposure:  ostracized from church community Total Time spent with patient: 1 hour  Past Psychiatric History:  - no formal psychiatric diagnosis, but treatment from outpatient provider for symptoms of anxiety/depression - no previous inpatient hospitalizations - Outpatient provider is Dr. Malachy MoanNatalie Sadler (private practice) - No previous suicide attempts  Is the patient at risk to self? Yes.    Has the patient been a risk to self in the past 6 months? Yes.    Has the patient been a risk to self within the distant past? Yes.    Is the patient a risk to others? Yes.    Has the patient been a risk to others in the past 6 months? Yes.    Has the patient been a risk to others within the distant past? Yes.     Prior Inpatient Therapy:   Prior Outpatient Therapy:    Alcohol Screening: 1. How often do you have a drink containing alcohol?: 2 to 4 times a month 2. How many drinks containing alcohol do you have on a typical day when you are drinking?: 1 or 2 3. How often do you have six or more drinks on one occasion?: Less than monthly AUDIT-C Score: 3 4. How often during the last year have you found that you were not able to stop drinking once you had started?: Never 5. How often during the last year have you failed to do what was normally expected from you becasue of drinking?: Never 6. How often during the last year have you needed a first drink in the morning to get yourself going after a heavy drinking session?: Never 7. How often during the last year have you had a feeling of guilt of remorse after drinking?: Never 8. How often during the last year have you been unable to remember what happened the night before because you had been drinking?: Never 9. Have you or someone else been injured as a result of your  drinking?: No 10. Has a relative or friend or a doctor or another health worker been concerned about your drinking or suggested you cut down?: No Alcohol Use Disorder Identification Test Final Score (AUDIT): 3 Intervention/Follow-up: AUDIT Score <7 follow-up not indicated Substance Abuse History in the last 12 months:  Yes.   Consequences of Substance Abuse: Negative Previous Psychotropic Medications: Yes  Psychological Evaluations: Yes  Past Medical History:  Past Medical History:  Diagnosis Date  . Anxiety    History reviewed. No pertinent surgical history. Family History: History reviewed. No pertinent family history. Family Psychiatric  History: anxiety in mother, alcohol abuse in maternal and paternal grandparents Tobacco Screening: Have you used any form of tobacco in the last 30 days? (Cigarettes, Smokeless Tobacco, Cigars, and/or Pipes): No Social History:  - [place] and raised in [place]. Lives with her partner in the [place] area. Has masters in education. Works as an Civil engineer, contractingexecutive coach. Never married but has been with her partner for 19 years. No children. No legal history. Denies trauma history aside from recently being asked to leave her church.  Social History   Substance and Sexual Activity  Alcohol Use Yes     Social History   Substance and Sexual Activity  Drug Use No    Additional Social History: Marital status: Long term relationship Long term relationship, how long?: 20 years  What types of issues is patient dealing with in the relationship?: N/A Are you sexually active?: Yes What is your sexual orientation?: Homosexual  Does patient have children?: No                         Allergies:  No Known Allergies Lab Results: No results found for this or any previous visit (from the past 48 hour(s)).  Blood Alcohol level:  Lab Results  Component Value Date   ETH <10 06/05/2017    Metabolic Disorder Labs:  No results found for:  HGBA1C, MPG No results found for: PROLACTIN No results found for: CHOL, TRIG, HDL, CHOLHDL, VLDL, LDLCALC  Current Medications: Current Facility-Administered Medications  Medication Dose Route Frequency Provider Last Rate Last Dose  . acetaminophen (TYLENOL) tablet 650 mg  650 mg Oral Q6H PRN Laveda Abbe, NP      . alum & mag hydroxide-simeth (MAALOX/MYLANTA) 200-200-20 MG/5ML suspension 30 mL  30 mL Oral Q4H PRN Laveda Abbe, NP      . hydrOXYzine (ATARAX/VISTARIL) tablet 25 mg  25 mg Oral TID PRN Laveda Abbe, NP      . ibuprofen (ADVIL,MOTRIN) tablet 600 mg  600 mg Oral Q6H PRN Donell Sievert E, PA-C   600 mg at 06/07/17 0409  . magnesium hydroxide (MILK OF MAGNESIA) suspension 30 mL  30 mL Oral Daily PRN Laveda Abbe, NP      . OLANZapine [facility]) tablet 5 mg  5 mg Oral QHS Laveda Abbe, NP   5 mg at 06/06/17 2233  . traZODone (DESYREL) tablet 50 mg  50 mg Oral QHS PRN Laveda Abbe, NP      . vitamin A capsule 10,000 Units  10,000 Units Oral Daily Laveda Abbe, NP   10,000 Units at 06/07/17 1211   PTA Medications: Medications Prior to Admission  Medication Sig Dispense Refill Last Dose  . Cyanocobalamin (VITAMIN B 12 PO) Take 1 tablet by mouth daily.   Past Month at Unknown time  . Lactobacillus Rhamnosus, GG, (CULTURELLE PO) Take 1 tablet by mouth daily.   Past Month at Unknown time  . LEVOCARNITINE-B5-TAURINE PO Take 1 tablet by mouth at bedtime.   06/04/2017 at Unknown time  . MAGNESIUM LACTATE PO Take 1 tablet by mouth 3 (three) times daily.   06/05/2017 at Unknown time  . Melatonin 1 MG/ML LIQD Take by mouth. 1/2 TO 1 MG VIA SPRAY QHS   Past Week at Unknown time  . Multiple Vitamins-Minerals (ZINC PO) Take 1 tablet by mouth 2 (two) times daily.   Past Month at Unknown time  . NONFORMULARY OR COMPOUNDED ITEM Take 1 capsule by mouth 2 (two) times daily. COMPOUNDED MEDICATIONS OF MULTIPLE VITAMINS/PHARMACY IN [place]    06/05/2017 at Unknown time  . OLANZapine (ZYPREXA) 2.5 MG tablet Take 2.5 mg by mouth at bedtime. AND 2.5 MG IF THE MORNING IF NEEDED  0 06/04/2017 at Unknown time  . Omega-3 Fatty Acids (OMEGA 3 PO) Take 1 tablet by mouth 2 (two) times daily.   06/05/2017 at Unknown time  . OVER THE COUNTER MEDICATION Take 1 tablet by mouth daily. HPA COMPOUNDED CAPSULE   06/05/2017  at Unknown time  . OVER THE COUNTER MEDICATION Take 1 tablet by mouth daily. L CARNITINE TABLET   06/04/2017 at Unknown time  . Pyridoxine HCl (VITAMIN B-6 PO) Take 1 tablet by mouth 2 (two) times daily.   Past Month at Unknown time  . risperiDONE (RISPERDAL) 0.5 MG tablet Take 0.5 mg by mouth at bedtime.  0 Past Month at Unknown time  . risperiDONE (RISPERDAL) 2 MG tablet Take 2 mg by mouth 2 (two) times daily.  0 Past Month at Unknown time  . vitamin A 16109 UNIT capsule Take 10,000 Units by mouth daily.   06/04/2017 at Unknown time    Musculoskeletal: Strength & Muscle Tone: within normal limits Gait & Station: normal Patient leans: N/A  Psychiatric Specialty Exam: Physical Exam  Nursing note and vitals reviewed.   Review of Systems  Constitutional: Negative for chills and fever.  Respiratory: Negative for cough and shortness of breath.   Cardiovascular: Negative for chest pain.  Gastrointestinal: Negative for abdominal pain, heartburn, nausea and vomiting.  Psychiatric/Behavioral: Positive for depression and substance abuse. Negative for hallucinations and suicidal ideas. The patient is nervous/anxious and has insomnia.     Blood pressure 125/76, pulse (!) 104, temperature 98.1 F (36.7 C), temperature source Oral, resp. rate 16, height 5\' 3"  (1.6 m), weight 65.8 kg (145 lb).Body mass index is 25.69 kg/m.  General Appearance: Casual and Well Groomed  Eye Contact:  Good  Speech:  Clear and Coherent and Normal Rate  Volume:  Normal  Mood:  Euthymic  Affect:  Appropriate and Congruent  Thought Process:  Coherent and Goal  Directed  Orientation:  Full (Time, Place, and Person)  Thought Content:  Logical and Ideas of Reference:   Delusions  Suicidal Thoughts:  No  Homicidal Thoughts:  No  Memory:  Immediate;   Fair Recent;   Fair Remote;   Fair  Judgement:  Fair  Insight:  Fair  Psychomotor Activity:  Normal  Concentration:  Concentration: Fair  Recall:  [employer] of Knowledge:  Fair  Language:  Fair  Akathisia:  No  Handed:    AIMS (if indicated):     Assets:  Communication Skills Leisure Time Physical Health Resilience  ADL's:  Intact  Cognition:  WNL  Sleep:  Number of Hours: 4.75    Treatment Plan Summary: Daily contact with patient to assess and evaluate symptoms and progress in treatment and Medication management  Observation Level/Precautions:  15 minute checks  Laboratory:  CBC Chemistry Profile HCG UDS  Psychotherapy:  Encourage participation in groups and therapeutic mileu  Medications:  Continue zyprexa 5mg  po qhs  Consultations:    Discharge Concerns:    Estimated LOS: 2-3 days  Other:     Physician Treatment Plan for Primary Diagnosis: PTSD (post-traumatic stress disorder) Long Term Goal(s): Improvement in symptoms so as ready for discharge  Short Term Goals: Ability to identify and develop effective coping behaviors will improve  Physician Treatment Plan for Secondary Diagnosis: Principal Problem:   PTSD (post-traumatic stress disorder)  Long Term Goal(s): Improvement in symptoms so as ready for discharge  Short Term Goals: Compliance with prescribed medications will improve  I certify that inpatient services furnished can reasonably be expected to improve the patient's condition.    Micheal Likens, MD 1/31/20194:14 PM

## 2017-06-07 NOTE — Progress Notes (Signed)
[facility] Group Notes:  (Nursing/MHT/Case Management/Adjunct)  Date:  [id number]  Time:  2000  Type of Therapy:  Group Therapy  Participation Level:  Active  Participation Quality:  Appropriate, Sharing and Supportive  Affect:  Appropriate  Cognitive:  Appropriate  Insight:  Appropriate  Engagement in Group:  Engaged  Modes of Intervention:  Discussion  Summary of Progress/Problems: Pt explained that she is new on the unit. Stated, "I look forward to tomorrow".   Fransico MichaelBrooks, Marlayna Bannister Laverne 06/07/2017, 4:32 AM

## 2017-06-07 NOTE — [facility] Suicide Risk Assessment (Signed)
[facility]   Nursing information obtained from:    Demographic factors:    Current Mental Status:    Loss Factors:    Historical Factors:    Risk Reduction Factors:     Total Time spent with patient: 1 hour Principal Problem: PTSD (post-traumatic stress disorder) Diagnosis:   Patient Active Problem List   Diagnosis Date Noted  . PTSD (post-traumatic stress disorder) [F43.10] 06/07/2017  . Paranoid delusion (HCC) [F22] 06/06/2017  . Bipolar I disorder, current or most recent episode manic, with psychotic features (HCC) [F31.2]    Subjective Data: See H&P for full HPI  Suzanne Frye is a 57 y/o F with no formal psychiatric history who was admitted on IVC placed in ED after she self-presented to ED with report of worsening symptoms of anxiety. During her presentation, pt appeared pressured, labile, and disorganized. She had made statements that she was recently paranoid regarding co-workers, and that she has been monitored via her phone. She also reported going to a gun range with a friend recently and she had made vague suicidal statement to her mother. As pt was requesting to be discharged last night, she was placed on IVC with concern for worsening manic symptoms, and [facility] and [facility]. Pt had dose of zyprexa increased, and she reported she is slept well. Upon initial evaluation, pt reported improvement over her symptoms over the last week with initialization of use of zyprexa, and she was in agreement to contniue zyprexa at higher dose on the inpatient unit.  Continued Clinical Symptoms:  Alcohol Use Disorder Identification Test Final Score (AUDIT): 3 The "Alcohol Use Disorders Identification Test", Guidelines for Use in Primary Care, Second Edition.  World Science writerHealth Organization [facility](WHO). Score between 0-7:  no or low risk or alcohol related problems. Score between 8-15:  moderate risk of alcohol related problems. Score  between 16-19:  high risk of alcohol related problems. Score 20 or above:  warrants further diagnostic evaluation for alcohol dependence and treatment.   CLINICAL FACTORS:   Severe Anxiety and/or Agitation More than one psychiatric diagnosis   Musculoskeletal: Strength & Muscle Tone: within normal limits Gait & Station: normal Patient leans: N/A  Psychiatric Specialty Exam: Physical Exam  Nursing note and vitals reviewed.   ROS - see H&P  Blood pressure 125/76, pulse (!) 104, temperature 98.1 F (36.7 C), temperature source Oral, resp. rate 16, height 5\' 3"  (1.6 m), weight 65.8 kg (145 lb).Body mass index is 25.69 kg/m.  General Appearance: Casual and Well Groomed  Eye Contact:  Good  Speech:  Clear and Coherent and Normal Rate  Volume:  Normal  Mood:  Euthymic  Affect:  Appropriate and Congruent  Thought Process:  Coherent and Goal Directed  Orientation:  Full (Time, Place, and Person)  Thought Content:  Logical and Ideas of Reference:   Delusions  Suicidal Thoughts:  No  Homicidal Thoughts:  No  Memory:  Immediate;   Fair Recent;   Fair Remote;   Fair  Judgement:  Fair  Insight:  Fair  Psychomotor Activity:  Normal  Concentration:  Concentration: Fair  Recall:  [employer]  Fund of Knowledge:  Fair  Language:  Fair  Akathisia:  No  Handed:    AIMS (if indicated):     Assets:  Communication Skills Leisure Time Physical Health Resilience  ADL's:  Intact  Cognition:  WNL  Sleep:  Number of Hours: 4.75  COGNITIVE FEATURES THAT CONTRIBUTE TO RISK:  None    SUICIDE RISK:   Minimal: No identifiable suicidal ideation.  Patients presenting with no risk factors but with morbid ruminations; may be classified as minimal risk based on the severity of the depressive symptoms  PLAN OF CARE:   - Admit to inpatient psychiatry unit  - PTSD vs bipolar disorder unspecified   - Continue zyprexa 5mg  po qhs  -Anxiety   -Continue atarax 25mg  po q8h prn  anxiety  -Insomnia  - Continue trazodone 50mg  po qhs prn insomnia  -Encourage participation in groups and the therapeutic milieu  -Discharge planning will be ongoing  I certify that inpatient services furnished can reasonably be expected to improve the patient's condition.   Micheal Likens, MD 06/07/2017, 4:32 PM

## 2017-06-07 NOTE — [facility] Group Notes (Signed)
LCSW Group Therapy 06/07/2017 1:15pm  Type of Therapy and Topic:  Group Therapy:  Change and Accountability  Participation Level:  Did Not Attend  Description of Group In this group, patients discussed power and accountability for change.  The group identified the challenges related to accountability and the difficulty of accepting the outcomes of negative behaviors.  Patients were encouraged to openly discuss a challenge/change they could take responsibility for.  Patients discussed the use of "change talk" and positive thinking as ways to support achievement of personal goals.  The group discussed ways to give support and empowerment to peers.  Therapeutic Goals: 1. Patients will state the relationship between personal power and accountability in the change process 2. Patients will identify the positive and negative consequences of a personal choice they have made 3. Patients will identify one challenge/choice they will take responsibility for making 4. Patients will discuss the role of "change talk" and the impact of positive thinking as it supports successful personal change 5. Patients will verbalize support and affirmation of change efforts in peers  Summary of Patient Progress:    Therapeutic Modalities Solution Focused Brief Therapy Motivational Interviewing Cognitive Behavioral Therapy  Suzanne Frye Suzanne Frye, Student-Social Work 06/07/2017 2:13 PM

## 2017-06-07 NOTE — Progress Notes (Signed)
D:Pt minimizes her reasons for coming to the hospital. She talked about taking a "pediatric dose" of her medication. Pt talked about using supplements as recommended by her therapist. She rates depression and anxiety as a 0.   A:Offered support, encouragement and 15 minute checks.  R:Pt denies si and hi. Safety maintained on the unit.

## 2017-06-08 MED ORDER — HYDROXYZINE HCL 25 MG PO TABS: 25.0000 mg | ORAL_TABLET | Freq: Three times a day (TID) | ORAL | 0 refills | Status: AC | PRN

## 2017-06-08 MED ORDER — TRAZODONE HCL 50 MG PO TABS: 50.0000 mg | ORAL_TABLET | Freq: Every evening | ORAL | 0 refills | Status: AC | PRN

## 2017-06-08 MED ORDER — OLANZAPINE 5 MG PO TABS: 5.0000 mg | ORAL_TABLET | Freq: Every day | ORAL | 0 refills | Status: AC

## 2017-06-08 MED ORDER — VITAMIN A 10000 UNITS PO CAPS: 10000.0000 [IU] | ORAL_CAPSULE | Freq: Every day | ORAL | Status: AC

## 2017-06-08 NOTE — Progress Notes (Signed)
Recreation Therapy Notes  Date: 06/08/17 Time: 1000 Location: [address] Dayroom  Group Topic: Communication, Team Building, Problem Solving  Goal Area(s) Addresses:  Patient will effectively work with peer towards shared goal.  Patient will identify skill used to make activity successful.  Patient will identify how skills used during activity can be used to reach post d/c goals.   Intervention: STEM Activity   Activity: [employer] Tower. In teams, patients were asked to build the tallest freestanding tower possible out of 15 pipe cleaners. Systematically resources were removed, for example patient ability to use both hands and patient ability to verbally communicate.    Education: Pharmacist, communityocial Skills, Building control surveyorDischarge Planning.   Education Outcome: Acknowledges education/In group clarification offered/Needs additional education.   Clinical Observations/Feedback: Pt did not attend group.    Caroll RancherMarjette Ermine Stebbins, LRT/CTRS         Caroll RancherLindsay, Hanz Winterhalter A 06/08/2017 12:13 PM

## 2017-06-08 NOTE — Progress Notes (Signed)
  [facility] Discharge Plan :  Will you be returning to the same living situation after discharge:  Yes,  Home At discharge, do you have transportation home?: Yes,  Family Do you have the ability to pay for your medications: Yes,  Insurance  Release of information consent forms completed and in the chart;  Patient's signature needed at discharge.  Patient to Follow up at: Follow-up Information    [facility] Follow up on 06/11/2017.   Why:  Follow up is scheduled for Feb 4th with Madelaine EtienneKatherine Glenn. Appointment previously made by patient.  Contact information: [address] [place] [address] [phone]        Malachy Moanatalie Sadler Follow up.   Why:  Call for follow up appointment on February 5th.  The doctor is out of the office until the 5th.   Contact information: [address], [place], [place] [address] Phone: [phone]          Next level of care provider has access to [facility] Link:no  Safety Planning and Suicide Prevention discussed: Yes,  yes  Have you used any form of tobacco in the last 30 days? (Cigarettes, Smokeless Tobacco, Cigars, and/or Pipes): No  Has patient been referred to the Quitline?: N/A patient is not a smoker  Patient has been referred for addiction treatment: N/A  Aram BeechamAngel M Kenyetta Wimbish, Student-Social Work 06/08/2017, 10:30 AM

## 2017-06-08 NOTE — Progress Notes (Signed)
Recreation Therapy Notes  INPATIENT RECREATION TR PLAN  Patient Details Name: Suzanne Frye MRN: [id number] DOB: 30-Jun-1960 Today's Date: 06/08/2017  Rec Therapy Plan Is patient appropriate for Therapeutic Recreation?: Yes Treatment times per week: about 3 days Estimated Length of Stay: 5-7 days TR Treatment/Interventions: Group participation (Comment)  Discharge Criteria Pt will be discharged from therapy if:: Discharged Treatment plan/goals/alternatives discussed and agreed upon by:: Patient/family  Discharge Summary Short term goals set: Pt will identify at least 5 coping skills for anxiety after recreation therapy sessions. Short term goals met: Not met Progress toward goals comments: Groups attended Which groups?: Self-esteem Reason goals not met: Pt is being discharged. Therapeutic equipment acquired: N/A Reason patient discharged from therapy: Discharge from hospital Pt/family agrees with progress & goals achieved: Yes Date patient discharged from therapy: 06/08/17    Victorino Sparrow, LRT/CTRS   Ria Comment, Averill Winters A 06/08/2017, 12:25 PM

## 2017-06-08 NOTE — Progress Notes (Signed)
Patient ID: Suzanne Frye, female   DOB: 10/05/1960, 57 y.o.   MRN: [id number] Patient discharged to home/self care in the company of her family.  Patient became irritated about diagnosis and refused to leave until she spoke to the physician. Patient acknowledged understanding of discharge instructions and receipt of all belongings.

## 2017-06-08 NOTE — [facility] Suicide Risk Assessment (Signed)
[facility] Discharge Suicide Risk Assessment   Principal Problem: PTSD (post-traumatic stress disorder) Discharge Diagnoses:  Patient Active Problem List   Diagnosis Date Noted  . PTSD (post-traumatic stress disorder) [F43.10] 06/07/2017  . Paranoid delusion (HCC) [F22] 06/06/2017  . Bipolar I disorder, current or most recent episode manic, with psychotic features (HCC) [F31.2]     Total Time spent with patient: 30 minutes  Musculoskeletal: Strength & Muscle Tone: within normal limits Gait & Station: normal Patient leans: N/A  Psychiatric Specialty Exam: Review of Systems  Constitutional: Negative for chills and fever.  Respiratory: Negative for cough and shortness of breath.   Cardiovascular: Negative for chest pain.  Gastrointestinal: Negative for heartburn and nausea.  Neurological: Negative for dizziness.  Psychiatric/Behavioral: Negative for depression, hallucinations and suicidal ideas. The patient is nervous/anxious. The patient does not have insomnia.     Blood pressure 135/85, pulse (!) 105, temperature 99 F (37.2 C), resp. rate 16, height 5\' 3"  (1.6 m), weight 65.8 kg (145 lb).Body mass index is 25.69 kg/m.  General Appearance: Casual  Eye Contact::  Good  Speech:  Clear and Coherent and Normal Rate  Volume:  Normal  Mood:  Euthymic  Affect:  Appropriate, Congruent and Constricted  Thought Process:  Coherent and Goal Directed  Orientation:  Full (Time, Place, and Person)  Thought Content:  Logical  Suicidal Thoughts:  No  Homicidal Thoughts:  No  Memory:  Immediate;   Fair Recent;   Fair Remote;   Fair  Judgement:  Fair  Insight:  Fair  Psychomotor Activity:  Normal  Concentration:  Fair  Recall:  [employer] of Knowledge:Fair  Language: Fair  Akathisia:  No  Handed:    AIMS (if indicated):     Assets:  Communication Skills Leisure Time Physical Health Resilience  Sleep:  Number of Hours: 4.75  Cognition: WNL  ADL's:  Intact   Mental Status Per  Nursing Assessment::   On Admission:     Demographic Factors:  Caucasian and Gay, lesbian, or bisexual orientation  Loss Factors: Decrease in vocational status and Loss of significant relationship  Historical Factors: Impulsivity  Risk Reduction Factors:   Living with another person, especially a relative, Positive social support, Positive therapeutic relationship and Positive coping skills or problem solving skills  Continued Clinical Symptoms:  Alcohol/Substance Abuse/Dependencies More than one psychiatric diagnosis Previous Psychiatric Diagnoses and Treatments  Cognitive Features That Contribute To Risk:  None    Suicide Risk:  Minimal: No identifiable suicidal ideation.  Patients presenting with no risk factors but with morbid ruminations; may be classified as minimal risk based on the severity of the depressive symptoms  Follow-up Information    [facility] Follow up on 06/11/2017.   Why:  Follow up is scheduled for Feb 4th with Madelaine Etienne. Appointment previously made by patient.  Contact information: [address] [place] [address] [phone]        Malachy Moan Follow up.   Why:  Call for follow up appointment on February 5th.  The doctor is out of the office until the 5th.   Contact information: [address] [place], [place] [address] Phone: [phone]        Subjective Data:  Gennavieve Huq is a 57 y/o F with no formal psychiatric history who was admitted on IVC placed in ED after she self-presented to ED with report of worsening symptoms of anxiety. During her presentation, pt appeared pressured, labile, and disorganized.  She had made statements that she was recently paranoid regarding co-workers, and that she has been monitored via her phone. She also reported going to a gun range with a friend recently and she had made vague suicidal statement to her mother. As pt was requesting to be discharged last  night, she was placed on IVC with concern for worsening manic symptoms, and she was transferred to [facility] for additional treatment and evaluation. Pt had dose of zyprexa increased, and she reported she is slept well. [facility] pt reported improvement over her symptoms over the last week with initialization of use of zyprexa, and she was in agreement to contniue zyprexa at higher dose on the inpatient unit of zyprexa 5mg  po qhs.  Today upon evaluation, pt shares, "I didn't have a good night, and now I'm not having a good day." Pt confides, "I wasn't completely honest with you yesterday - I was trying to make it seem like I wasn't doing so bad." Pt thinks she has been struggling with pervasive paranoid thoughts and as a response she began to misuse xanax and alcohol in the recent weeks. She feels better now and notes that paranoia has been diminishing with higher dose of zyprexa. She denies SI/HI/AH/VH. She is highly motivated to follow up with her outpatient provider. Pt was counseled to avoid illicit use of unprescribed medications, and she verbalized good understanding. Pt was counseled to avoid use of alcohol with any of her medications, and she verbalized good understanding. She is in agreement to continue her current regimen without changes. She was able to engage in safety planning including [facility] or contact emergency services if she feels unable to maintain her own safety or the safety of others. Pt had no further questions, comments, or concerns. She was able to engage in safety planning including [facility] or contact emergency services if she feels unable to maintain her own safety or the safety of others. Pt had no further questions, comments, or concerns.    Plan Of Care/Follow-up recommendations:   - Discharge to outpatient level of care  - PTSD, with anxiety and psychotic features             - Continue zyprexa 5mg  po qhs  -Anxiety               -Continue atarax 25mg  po q8h prn anxiety  -Insomnia             - Continue trazodone 50mg  po qhs prn insomnia  Activity:  as tolerated Diet:  normal Tests:  NA Other:  see above for DC plan  Micheal Likenshristopher T Lothar Prehn, MD 06/08/2017, 11:31 AM

## 2017-06-08 NOTE — Discharge Summary (Signed)
Physician Discharge Summary Note  Patient:  Suzanne Frye is an 57 y.o., female MRN:  [id number] DOB:  17-Jan-1961 Patient phone:  [phone] (home)  Patient address:   [address] [place] [address],   Total Time spent with patient: Greater than 30 minutes  Date of Admission:  06/06/2017  Date of Discharge: 06-08-17  Reason for Admission: Worsening anxiety, disorganized behavior & pressured speech.  Principal Problem: PTSD (post-traumatic stress disorder)  Discharge Diagnoses: Patient Active Problem List   Diagnosis Date Noted  . PTSD (post-traumatic stress disorder) [F43.10] 06/07/2017  . Paranoid delusion (HCC) [F22] 06/06/2017  . Bipolar I disorder, current or most recent episode manic, with psychotic features (HCC) [F31.2]    Past Psychiatric History: HX Bipolar disorder, PTSD.  Past Medical History:  Past Medical History:  Diagnosis Date  . Anxiety    History reviewed. No pertinent surgical history.  Family History: History reviewed. No pertinent family history.  Family Psychiatric  History: See H&P  Social History:  Social History   Substance and Sexual Activity  Alcohol Use Yes     Social History   Substance and Sexual Activity  Drug Use No    Social History   Socioeconomic History  . Marital status: Single    Spouse name: None  . Number of children: None  . Years of education: None  . Highest education level: None  Social Needs  . Financial resource strain: None  . Food insecurity - worry: None  . Food insecurity - inability: None  . Transportation needs - medical: None  . Transportation needs - non-medical: None  Occupational History  . None  Tobacco Use  . Smoking status: Never Smoker  . Smokeless tobacco: Never Used  Substance and Sexual Activity  . Alcohol use: Yes  . Drug use: No  . Sexual activity: None  Other Topics Concern  . None  Social History Narrative  . None   Hospital Course: (Per Md's discharge SRA): Suzanne Frye  is a 57 y/o F with no formal psychiatric history who was admitted on IVC placed in ED after she self-presented to ED with report of worsening symptoms of anxiety. During her presentation, pt appeared pressured, labile, and disorganized. She had made statements that she was recently paranoid regarding co-workers, and that she has been monitored via her phone. She also reported going to a gun range with a friend recently and she had made vague suicidal statement to her mother. As pt was requesting to be discharged last night, she was placed on IVC with concern for worsening manic symptoms, and she was transferred to [facility] for additional treatment and evaluation.Pt had dose of zyprexa increased, and she reported she is slept well. Upon initial evaluation, pt reported improvement over her symptoms over the last week with initialization of use of zyprexa, and she was in agreement to contniue zyprexa at higher dose on the inpatient unit of zyprexa 5mg  po qhs.  Today upon evaluation, pt shares, "I didn't have a good night, and now I'm not having a good day." Pt confides, "I wasn't completely honest with you yesterday - I was trying to make it seem like I wasn't doing so bad." Pt thinks she has been struggling with pervasive paranoid thoughts and as a response she began to misuse Xanax and alcohol in the recent weeks. She feels better now and notes that paranoia has been diminishing with higher dose of zyprexa. She denies SI/HI/AH/VH. She is highly motivated to follow up with  her outpatient provider. Pt was counseled to avoid illicit use of unprescribed medications, and she verbalized good understanding. Pt was counseled to avoid use of alcohol with any of her medications, and she verbalized good understanding. She is in agreement to continue her current regimen without changes. She was able to engage in safety planning including plan to return to [facility] or contact emergency services if she feels unable to maintain her own  safety or the safety of others. Pt had no further questions, comments, or concerns. She was able to engage in safety planning including plan to return to [facility] or contact emergency services if she feels unable to maintain her own safety or the safety of others. Pt had no further questions, comments, or concerns.  However, during her discharge instruction review, patient got very upset because she says she did not like her diagnosis of Bipolar disorder. She says, there is nothing mentally wrong with her except bad anxiety issues. She says she only would want to go home with just Zyprexa which she understood is for her anxiety. She is encouraged to take take her prescriptions for Trazodone & Hydroxyzine to help her deal with anxiety & insomnia. She wants to stay at the lobby & wait for the attending psychiatrist to finish with what he was doing at the time to come & explain to her the reason for her diagnosis. However, by the time the Md came out to the lobby, patient had already left with her family.  She left [facility] with all personal belongings. Transportation per family.   Physical Findings: AIMS: Facial and Oral Movements Muscles of Facial Expression: None, normal Lips and Perioral Area: None, normal Jaw: None, normal Tongue: None, normal,Extremity Movements Upper (arms, wrists, hands, fingers): None, normal Lower (legs, knees, ankles, toes): None, normal, Trunk Movements Neck, shoulders, hips: None, normal, Overall Severity Severity of abnormal movements (highest score from questions above): None, normal Incapacitation due to abnormal movements: None, normal Patient's awareness of abnormal movements (rate only patient's report): No Awareness, Dental Status Current problems with teeth and/or dentures?: No Does patient usually wear dentures?: No  CIWA:    COWS:     Musculoskeletal: Strength & Muscle Tone: within normal limits Gait & Station: normal Patient leans: N/A  Psychiatric Specialty  Exam: Physical Exam  Constitutional: She appears well-developed.  HENT:  Head: Normocephalic.  Eyes: Pupils are equal, round, and reactive to light.  Neck: Normal range of motion.  Cardiovascular: Normal rate.  Respiratory: Effort normal.  GI: Soft.  Genitourinary:  Genitourinary Comments: Deferred  Musculoskeletal: Normal range of motion.  Neurological: She is alert.  Skin: Skin is warm.    Review of Systems  Constitutional: Negative.   HENT: Negative.   Eyes: Negative.   Respiratory: Negative.   Cardiovascular: Negative.   Gastrointestinal: Negative.   Genitourinary: Negative.   Musculoskeletal: Negative.   Skin: Negative.   Neurological: Negative.   Endo/Heme/Allergies: Negative.   Psychiatric/Behavioral: Positive for depression (Stable) and hallucinations (Hx. Psychosis). Negative for memory loss and suicidal ideas. The patient has insomnia (Stable). The patient is not nervous/anxious.     Blood pressure 135/85, pulse (!) 105, temperature 99 F (37.2 C), resp. rate 16, height 5\' 3"  (1.6 m), weight 65.8 kg (145 lb).Body mass index is 25.69 kg/m.  See Md's SRA   Have you used any form of tobacco in the last 30 days? (Cigarettes, Smokeless Tobacco, Cigars, and/or Pipes): No  Has this patient used any form of tobacco in the last 30  days? (Cigarettes, Smokeless Tobacco, Cigars, and/or Pipes): N/A  Blood Alcohol level:  Lab Results  Component Value Date   ETH <10 06/05/2017   Metabolic Disorder Labs:  No results found for: HGBA1C, MPG No results found for: PROLACTIN No results found for: CHOL, TRIG, HDL, CHOLHDL, VLDL, LDLCALC  See Psychiatric Specialty Exam and Suicide Risk Assessment completed by Attending Physician prior to discharge.  Discharge destination:  Home  Is patient on multiple antipsychotic therapies at discharge:  No   Has Patient had three or more failed trials of antipsychotic monotherapy by history:  No  Recommended Plan for Multiple  Antipsychotic Therapies: NA  Allergies as of 06/08/2017   No Known Allergies     Medication List    STOP taking these medications   CULTURELLE PO   LEVOCARNITINE-B5-TAURINE PO   MAGNESIUM LACTATE PO   Melatonin 1 MG/ML Liqd   NONFORMULARY OR COMPOUNDED ITEM   OMEGA 3 PO   OVER THE COUNTER MEDICATION   OVER THE COUNTER MEDICATION   risperiDONE 0.5 MG tablet Commonly known as:  RISPERDAL   risperiDONE 2 MG tablet Commonly known as:  RISPERDAL   VITAMIN B 12 PO   VITAMIN B-6 PO   ZINC PO     TAKE these medications     Indication  hydrOXYzine 25 MG tablet Commonly known as:  ATARAX/VISTARIL Take 1 tablet (25 mg total) by mouth 3 (three) times daily as needed for anxiety.  Indication:  Feeling Anxious   OLANZapine 5 MG tablet Commonly known as:  ZYPREXA Take 1 tablet (5 mg total) by mouth at bedtime. For mood control What changed:    medication strength  how much to take  additional instructions  Indication:  Mood control   traZODone 50 MG tablet Commonly known as:  DESYREL Take 1 tablet (50 mg total) by mouth at bedtime as needed for sleep.  Indication:  Trouble Sleeping   vitamin A [address] UNIT capsule Take 1 capsule (10,000 Units total) by mouth daily. For vitamin supplementation Start taking on:  06/09/2017 What changed:  additional instructions  Indication:  Vitamin A Deficiency      Follow-up Information    [facility] Follow up on 06/11/2017.   Why:  Follow up is scheduled for Feb 4th with Madelaine Etienne. Appointment previously made by patient.  Contact information: [address] [place] [address] [phone]        Malachy Moan Follow up.   Why:  Call for follow up appointment on February 5th.  The doctor is out of the office until the 5th.   Contact information: [address], [place], [place] [address] Phone: [phone]         Follow-up recommendations: Activity:  As  tolerated Diet: As recommended by your primary care doctor. Keep all scheduled follow-up appointments as recommended.   Comments: Patient is instructed prior to discharge to: Take all medications as prescribed by his/her mental healthcare provider. Report any adverse effects and or reactions from the medicines to his/her outpatient provider promptly. Patient has been instructed & cautioned: To not engage in alcohol and or illegal drug use while on prescription medicines. In the event of worsening symptoms, patient is instructed to call the crisis hotline, 911 and or go to the nearest ED for appropriate evaluation and treatment of symptoms. To follow-up with his/her primary care provider for your other medical issues, concerns and or health care needs.   Signed: Armandina Stammer, NP,  PMHNP, FNP_BC 06/08/2017, 11:33 AM   Patient seen, Suicide Assessment Completed.  Disposition Plan Reviewed   Suzanne Frye is a 57 y/o F with no formal psychiatric history who was admitted on IVC placed in ED after she self-presented to ED with report of worsening symptoms of anxiety. During her presentation, pt appeared pressured, labile, and disorganized. She had made statements that she was recently paranoid regarding co-workers, and that she has been monitored via her phone. She also reported going to a gun range with a friend recently and she had made vague suicidal statement to her mother. As pt was requesting to be discharged last night, she was placed on IVC with concern for worsening manic symptoms, and [facility].Pt had dose of zyprexa increased, and she reported she is slept well. Upon initial evaluation, pt reported improvement over her symptoms over the last week with initialization of use of zyprexa, and she was in agreement to contniue zyprexa at higher dose on the inpatient unit of zyprexa 5mg  po qhs.  Today upon evaluation, pt shares, "I didn't have a  good night, and now I'm not having a good day." Pt confides, "I wasn't completely honest with you yesterday - I was trying to make it seem like I wasn't doing so bad." Pt thinks she has been struggling with pervasive paranoid thoughts and as a response she began to misuse xanax and alcohol in the recent weeks. She feels better now and notes that paranoia has been diminishing with higher dose of zyprexa. She denies SI/HI/AH/VH. She is highly motivated to follow up with her outpatient provider. Pt was counseled to avoid illicit use of unprescribed medications, and she verbalized good understanding. Pt was counseled to avoid use of alcohol with any of her medications, and she verbalized good understanding. She is in agreement to continue her current regimen without changes. She was able to engage in safety planning [facility] or contact emergency services if she feels unable to maintain her own safety or the safety of others. Pt had no further questions, comments, or concerns. She was able to engage in safety planning including [facility] or contact emergency services if she feels unable to maintain her own safety or the safety of others. Pt had no further questions, comments, or concerns.   Plan Of Care/Follow-up recommendations:   - Discharge to outpatient level of care  - PTSD, with anxiety and psychotic features - Continue zyprexa 5mg  po qhs  -Anxiety -Continue atarax 25mg  po q8h prn anxiety  -Insomnia - Continue trazodone 50mg  po qhs prn insomnia  Activity:  as tolerated Diet:  normal Tests:  NA Other:  see above for DC plan  Micheal Likenshristopher T Corrine Tillis, MD

## 2017-06-08 NOTE — Plan of Care (Signed)
Pt attended self esteem recreation therapy session.   Caroll RancherMarjette Nicolle Heward, LRT/CTRS

## 2018-03-20 ENCOUNTER — Ambulatory Visit
Admission: RE | Admit: 2018-03-20 | Discharge: 2018-03-20 | Disposition: A | Discharge status: Home / Self Care | Payer: BLUE CROSS/BLUE SHIELD | Source: Ambulatory Visit | Attending: Family Medicine | Admitting: Family Medicine

## 2018-03-20 ENCOUNTER — Other Ambulatory Visit: Payer: Self-pay | Admitting: Family Medicine

## 2018-03-20 DIAGNOSIS — R229 Localized swelling, mass and lump, unspecified: Principal | ICD-10-CM

## 2018-03-20 DIAGNOSIS — R1907 Generalized intra-abdominal and pelvic swelling, mass and lump: Secondary | ICD-10-CM

## 2018-03-20 DIAGNOSIS — IMO0002 Reserved for concepts with insufficient information to code with codable children: Secondary | ICD-10-CM

## 2018-03-25 ENCOUNTER — Other Ambulatory Visit: Payer: Self-pay | Admitting: Family Medicine

## 2018-03-25 DIAGNOSIS — Z1231 Encounter for screening mammogram for malignant neoplasm of breast: Secondary | ICD-10-CM

## 2018-03-26 ENCOUNTER — Other Ambulatory Visit: Payer: Self-pay | Admitting: Family Medicine

## 2018-03-26 DIAGNOSIS — Z1211 Encounter for screening for malignant neoplasm of colon: Secondary | ICD-10-CM

## 2018-10-31 ENCOUNTER — Other Ambulatory Visit: Payer: Self-pay | Admitting: Family Medicine

## 2018-10-31 DIAGNOSIS — R109 Unspecified abdominal pain: Secondary | ICD-10-CM

## 2018-10-31 DIAGNOSIS — R140 Abdominal distension (gaseous): Secondary | ICD-10-CM

## 2018-11-13 ENCOUNTER — Ambulatory Visit
Admission: RE | Admit: 2018-11-13 | Discharge: 2018-11-13 | Disposition: A | Discharge status: Home / Self Care | Payer: BC Managed Care – PPO | Source: Ambulatory Visit | Attending: Family Medicine | Admitting: Family Medicine

## 2018-11-13 DIAGNOSIS — R140 Abdominal distension (gaseous): Secondary | ICD-10-CM

## 2018-11-13 DIAGNOSIS — R109 Unspecified abdominal pain: Secondary | ICD-10-CM

## 2019-07-29 ENCOUNTER — Ambulatory Visit: Payer: BC Managed Care – PPO | Attending: Internal Medicine

## 2019-07-29 DIAGNOSIS — Z23 Encounter for immunization: Secondary | ICD-10-CM

## 2019-07-29 NOTE — Progress Notes (Signed)
   Covid-19 Vaccination Clinic  Name:  Suzanne Frye    MRN: [id number] DOB: 03-Aug-1960  07/29/2019  [name] was observed post Covid-19 immunization for 15 minutes without incident. She was provided with Vaccine Information Sheet and instruction to access the V-Safe system.   [name] was instructed to call 911 with any severe reactions post vaccine: Marland Kitchen Difficulty breathing  . Swelling of face and throat  . A fast heartbeat  . A bad rash all over body  . Dizziness and weakness   Immunizations Administered    Name Date Dose VIS Date Route   Pfizer COVID-19 Vaccine 07/29/2019  2:17 PM 0.3 mL 04/18/2019 Intramuscular   Manufacturer: [employer], [employer]   Lot: [id number]   NDC: [address]-6

## 2019-08-20 ENCOUNTER — Ambulatory Visit: Payer: BC Managed Care – PPO | Attending: Internal Medicine

## 2019-08-20 DIAGNOSIS — Z23 Encounter for immunization: Secondary | ICD-10-CM

## 2019-08-20 NOTE — Progress Notes (Signed)
   Cov[facility] Vaccination [facility]  Name:  FRANCILLE WITTMANN    MRN: [id number] DOB: 1960/12/12  08/20/2019  Ms. Bayly was observed post Covid-19 immunization for 15 minutes without incident. She was provided with Vaccine Information Sheet and instruction to access the V-Safe system.   Ms. Musleh was instructed to call 911 with any severe reactions post vaccine: Marland Kitchen Difficulty breathing  . Swelling of face and throat  . A fast heartbeat  . A bad rash all over body  . Dizziness and weakness   Immunizations Administered    Name Date Dose VIS Date Route   Pfizer COVID-19 Vaccine 08/20/2019 11:41 AM 0.3 mL 04/18/2019 Intramuscular   Manufacturer: [employer], [employer]   Lot: [id number]   NDC: [address]-1

## 2020-05-04 IMAGING — US US PELVIS COMPLETE TRANSABD/TRANSVAG
1 series · 14 of 25 positions shown · non-contrast
Comparison: None

CLINICAL DATA: Pelvic mass by physical exam.

EXAM:
TRANSABDOMINAL AND TRANSVAGINAL ULTRASOUND OF PELVIS
TECHNIQUE: Both transabdominal and transvaginal ultrasound examinations of the
pelvis were performed. Transabdominal technique was performed for
global imaging of the pelvis including uterus, ovaries, adnexal
regions, and pelvic cul-de-sac. It was necessary to proceed with
endovaginal exam following the transabdominal exam to visualize the
uterus and endometrium better advantage.

[Series 1: us pelvis complete transabd/transvag · 0.26mm/px · 14 of 51 slices shown]
[im 1/51]
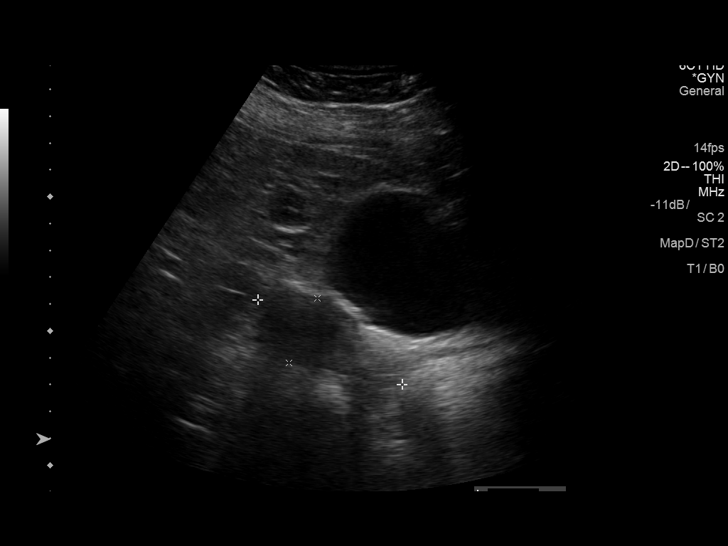
[im 5/51]
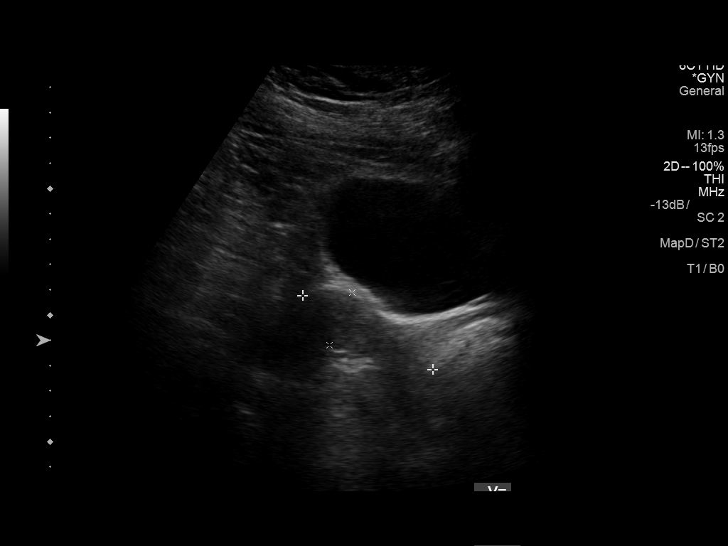
[im 9/51]
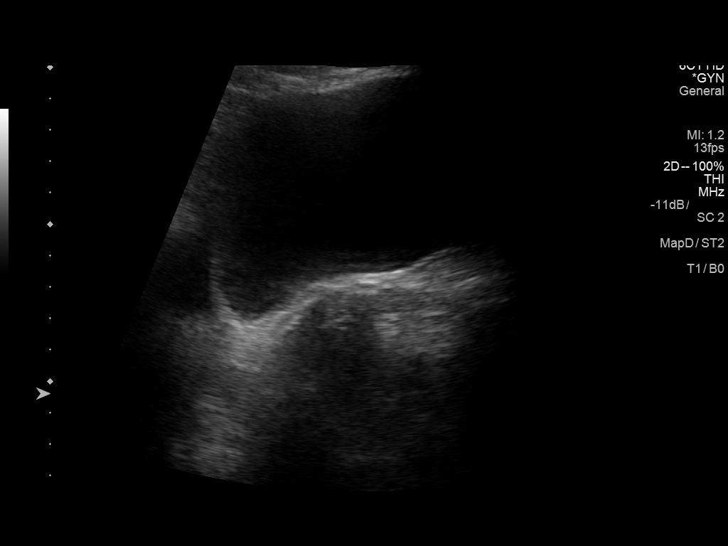
[im 13/51]
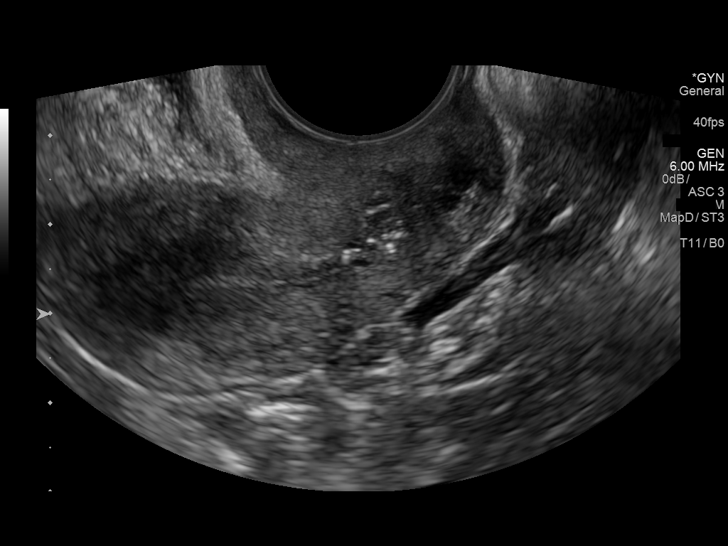
[im 17/51]
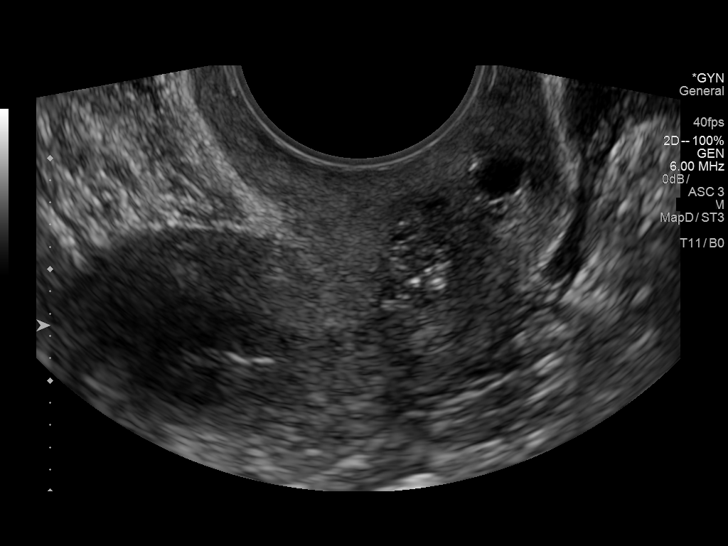
[im 19/51]
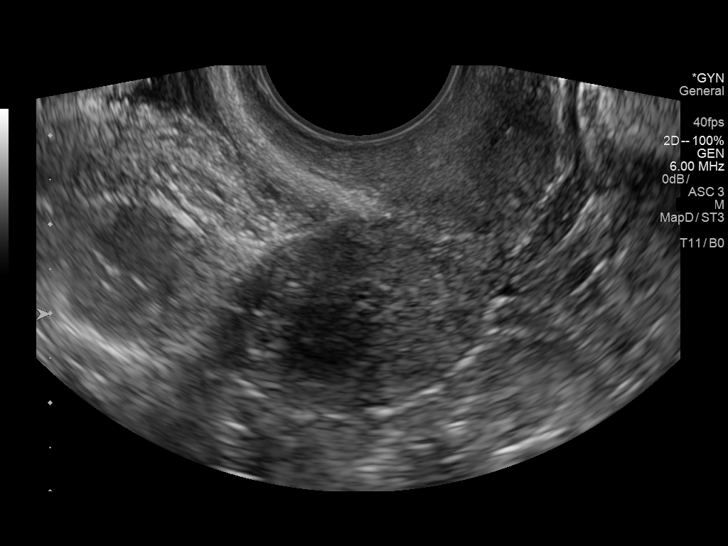
[im 23/51]
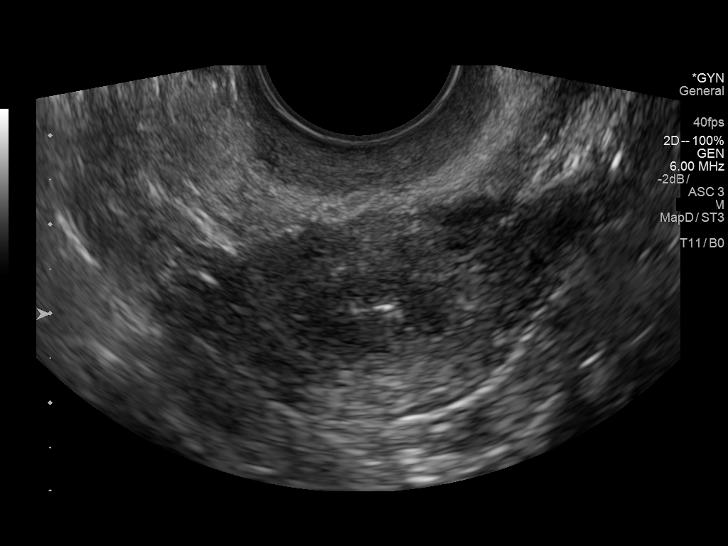
[im 28/51]
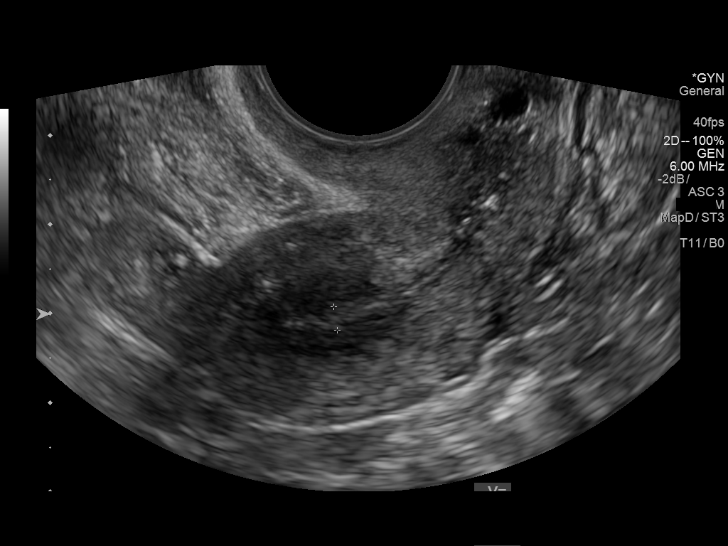
[im 32/51]
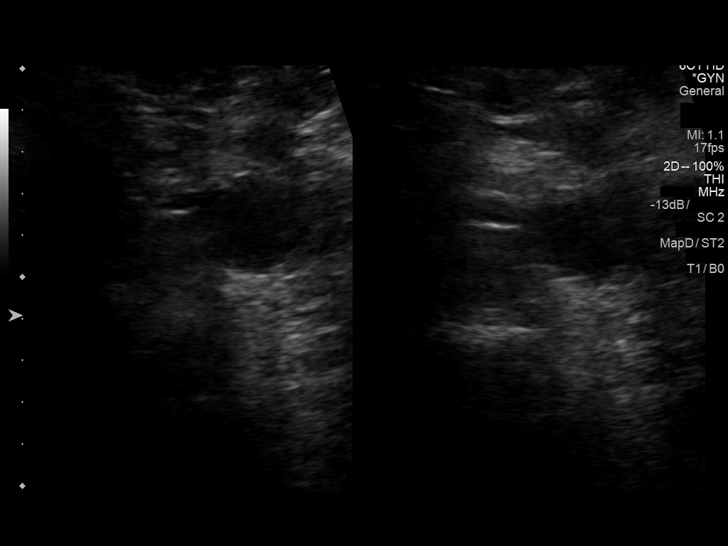
[im 34/51]
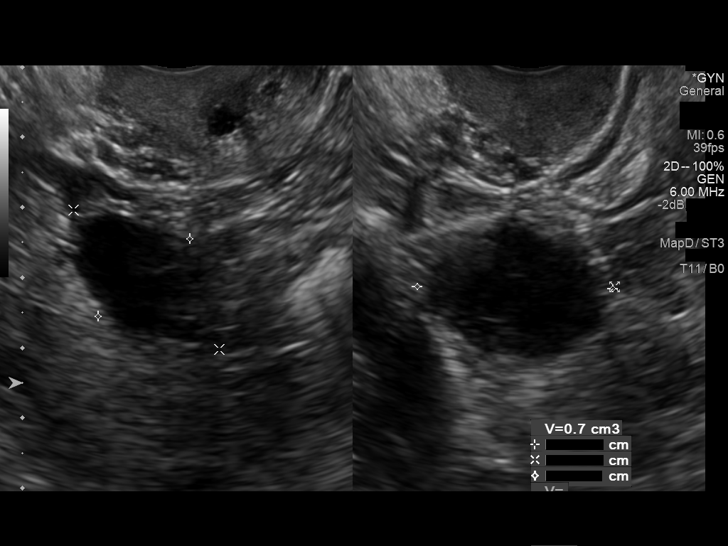
[im 38/51]
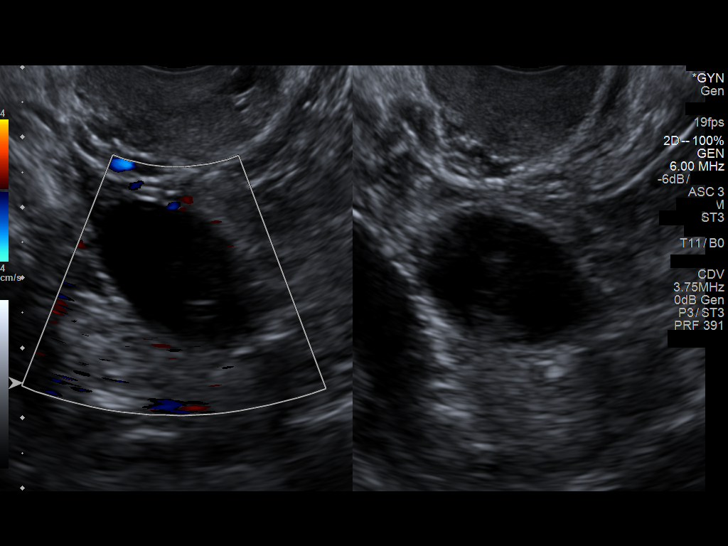
[im 42/51]
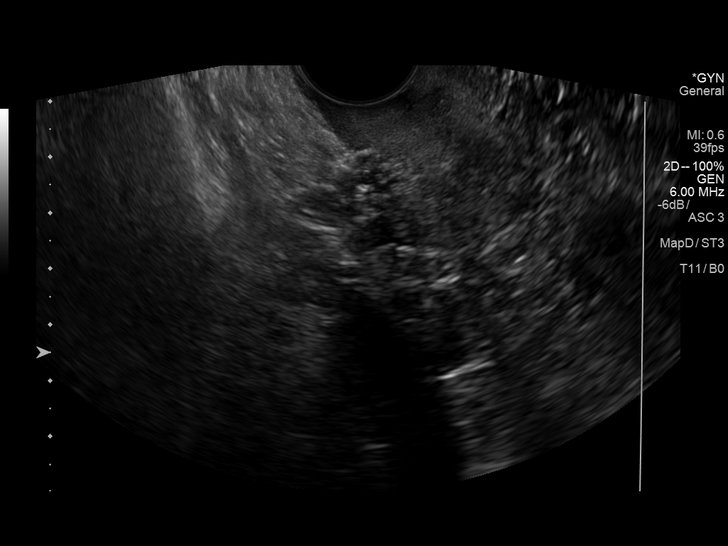
[im 46/51]
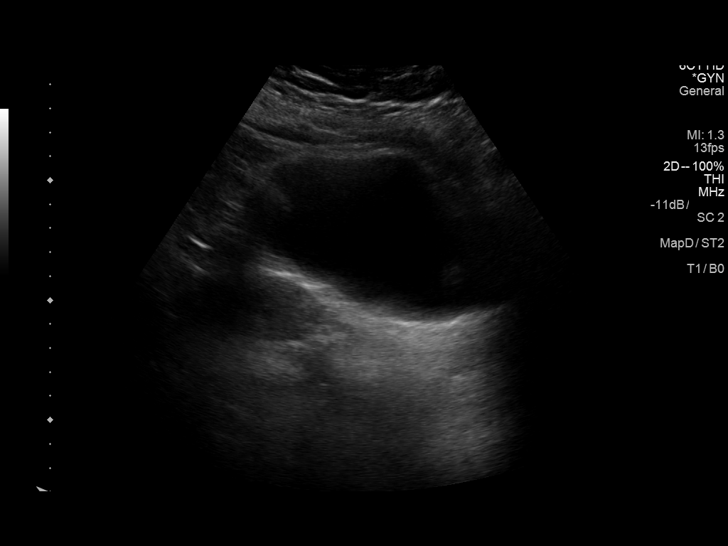
[im 51/51]
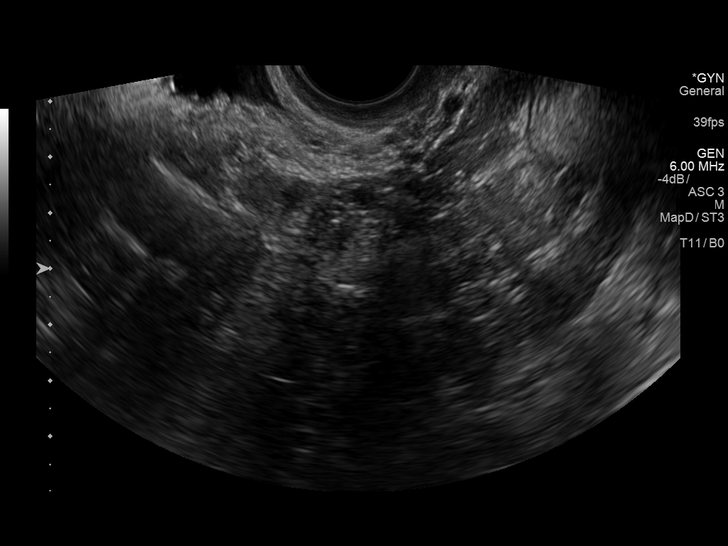

[14 of 25 positions shown; findings below may reference images not displayed]

FINDINGS: Uterus

Measurements: 5.6 x 2.5 x 3.7 cm = volume: 27 mL. No fibroids or
other mass visualized.

Endometrium

Thickness: 3 mm.  No focal abnormality visualized.

Right ovary

Measurements: 1.7 x 2.9 x 2.7 cm = volume: 0.7 mL. Dominant cyst
measuring 2.5 x 1.4 x 2.5 cm. No adnexal masses.

Left ovary

Not visualized.  No adnexal masses.

Other findings

No abnormal free fluid.
IMPRESSION: 1. Negative transabdominal and endovaginal pelvic ultrasound. Left
ovary not visualized. No pelvic masses.

## 2020-12-01 ENCOUNTER — Encounter (HOSPITAL_COMMUNITY): Payer: Self-pay

## 2020-12-01 ENCOUNTER — Other Ambulatory Visit: Payer: Self-pay

## 2020-12-01 ENCOUNTER — Emergency Department (HOSPITAL_COMMUNITY)
Admission: EM | Admit: 2020-12-01 | Discharge: 2020-12-01 | Disposition: A | Discharge status: Home / Self Care | Payer: BC Managed Care – PPO | Attending: Emergency Medicine | Admitting: Emergency Medicine

## 2020-12-01 DIAGNOSIS — R109 Unspecified abdominal pain: Secondary | ICD-10-CM | POA: Diagnosis not present

## 2020-12-01 DIAGNOSIS — R112 Nausea with vomiting, unspecified: Secondary | ICD-10-CM | POA: Insufficient documentation

## 2020-12-01 DIAGNOSIS — R0602 Shortness of breath: Secondary | ICD-10-CM | POA: Diagnosis not present

## 2020-12-01 DIAGNOSIS — E860 Dehydration: Secondary | ICD-10-CM | POA: Insufficient documentation

## 2020-12-01 DIAGNOSIS — Z5321 Procedure and treatment not carried out due to patient leaving prior to being seen by health care provider: Secondary | ICD-10-CM | POA: Insufficient documentation

## 2020-12-01 DIAGNOSIS — R197 Diarrhea, unspecified: Secondary | ICD-10-CM | POA: Diagnosis not present

## 2020-12-01 NOTE — ED Triage Notes (Signed)
Pt reports having diarrhea, SHOB, nausea, and vomiting for 1 day. Pt reports that she is dehydrated and has severe abdominal pain.

## 2020-12-01 NOTE — ED Notes (Signed)
MSE not signed, pt had to run out to use restroom.

## 2021-02-18 IMAGING — US ULTRASOUND ABDOMEN COMPLETE
1 series · 14 of 25 positions shown · non-contrast
Comparison: None.

CLINICAL DATA: Abdominal distension.

EXAM:
ABDOMEN ULTRASOUND COMPLETE

[Series 1: ultrasound abdomen complete · 0.23mm/px · 14 of 83 slices shown]
[im 1/83]
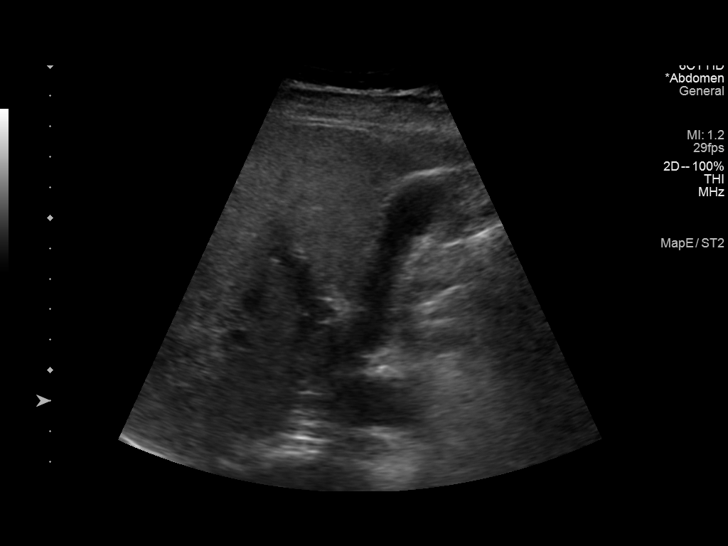
[im 7/83]
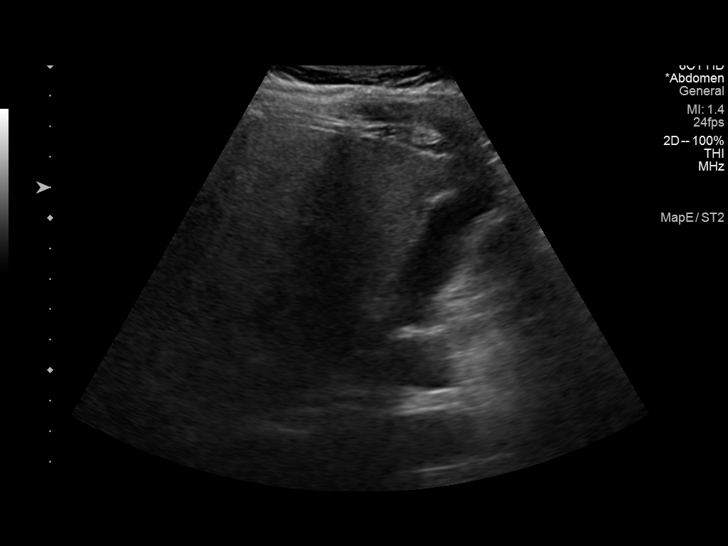
[im 14/83]
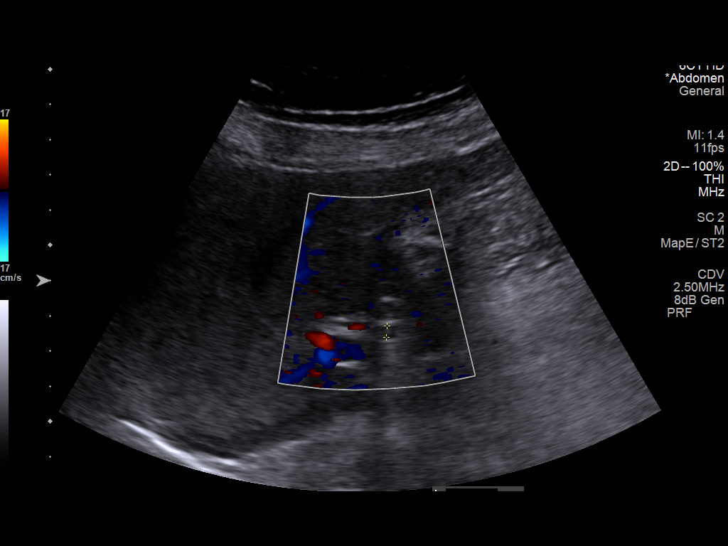
[im 21/83]
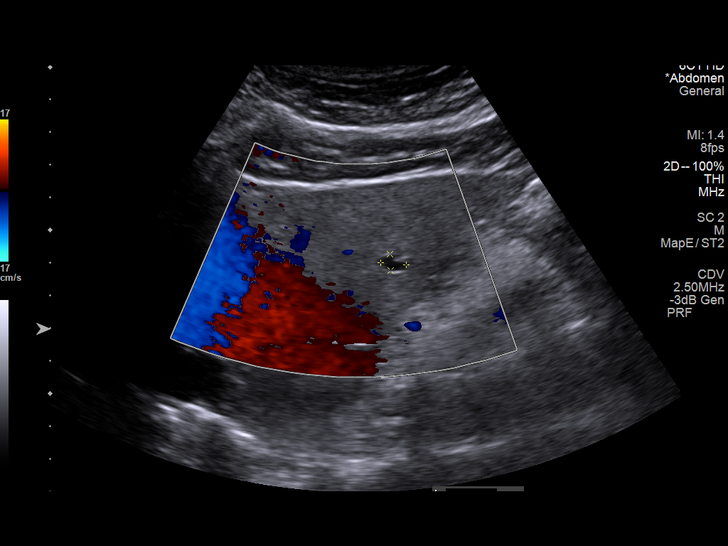
[im 28/83]
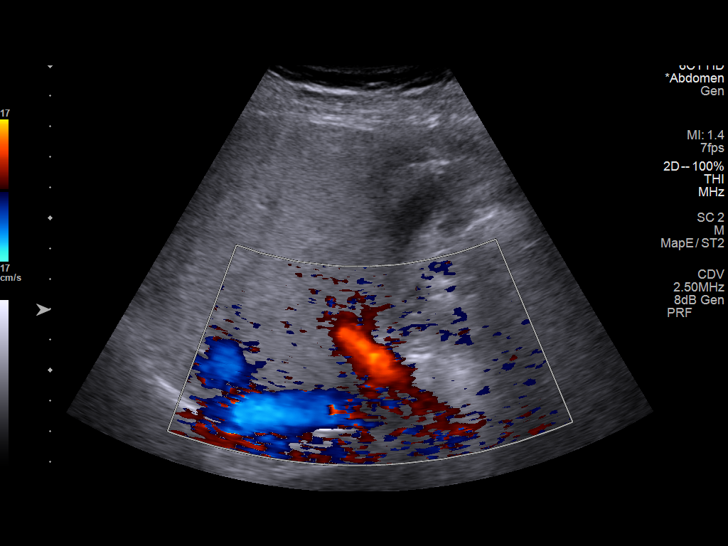
[im 31/83]
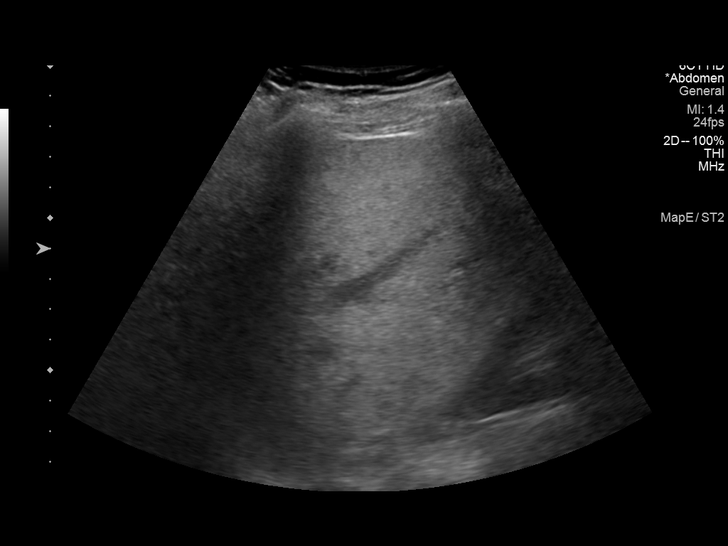
[im 38/83]
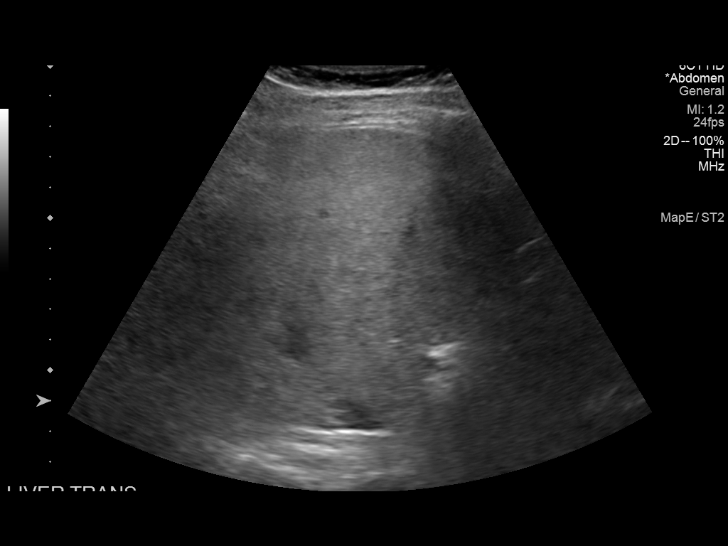
[im 45/83]
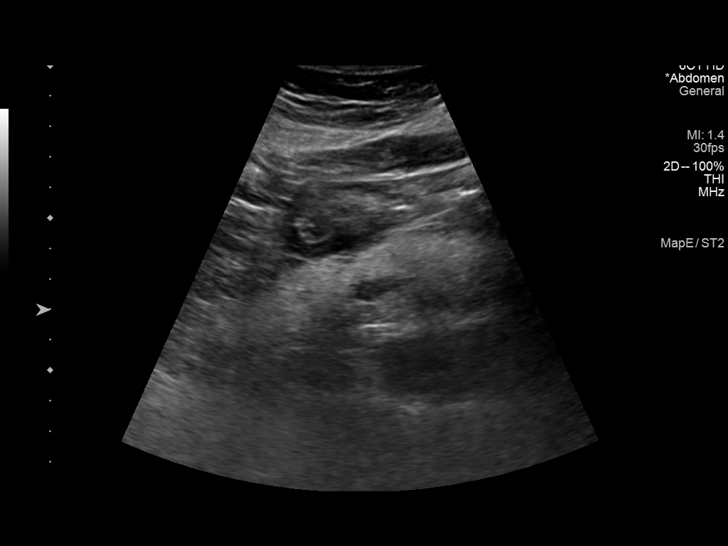
[im 52/83]
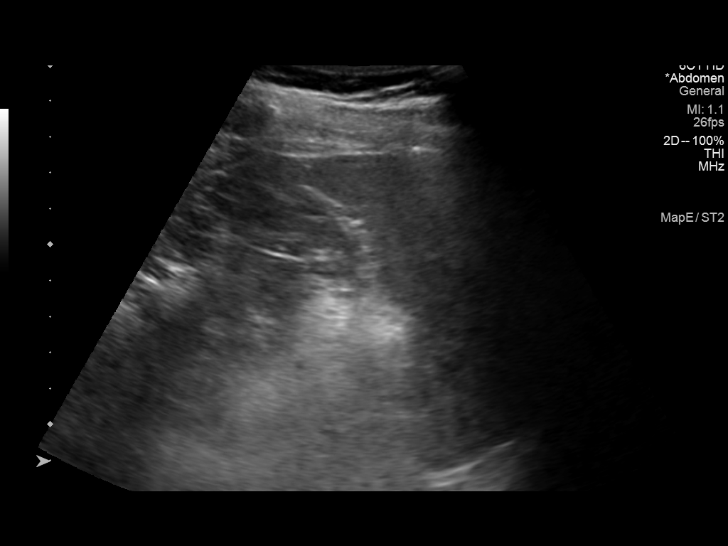
[im 55/83]
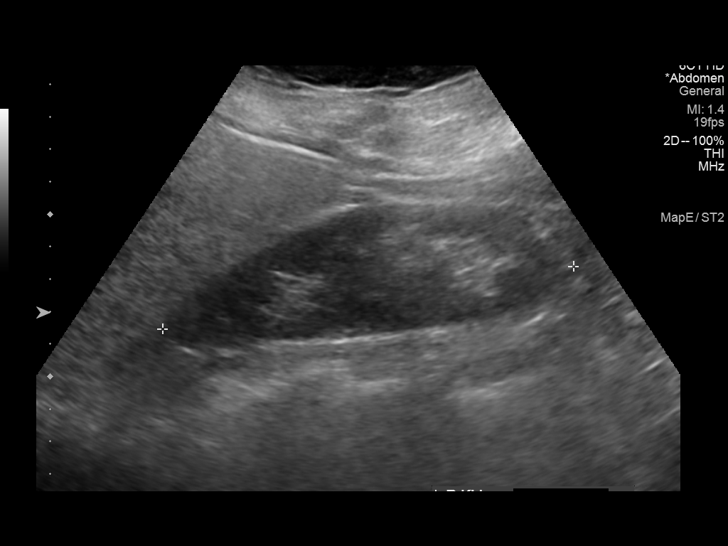
[im 62/83]
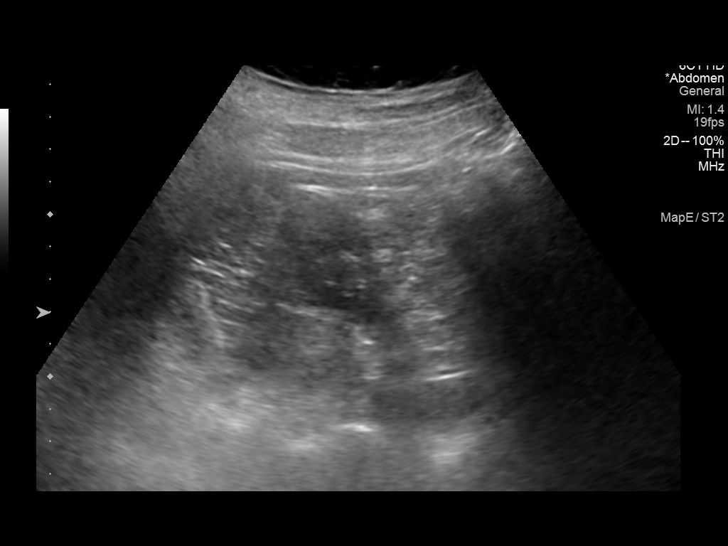
[im 69/83]
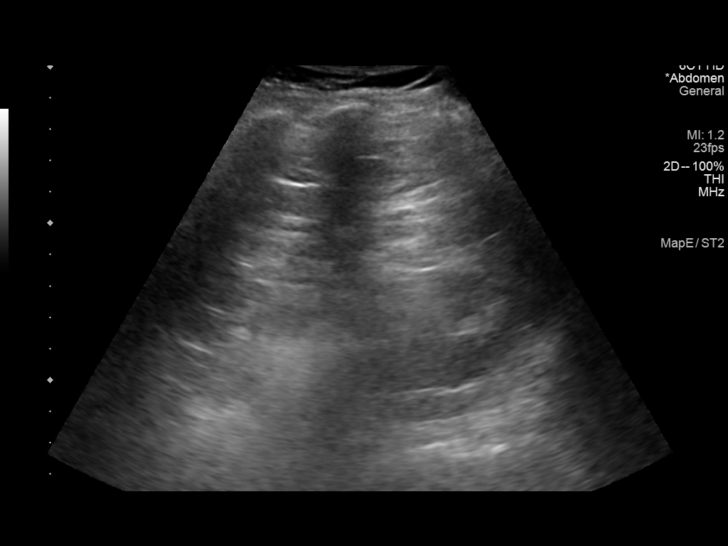
[im 76/83]
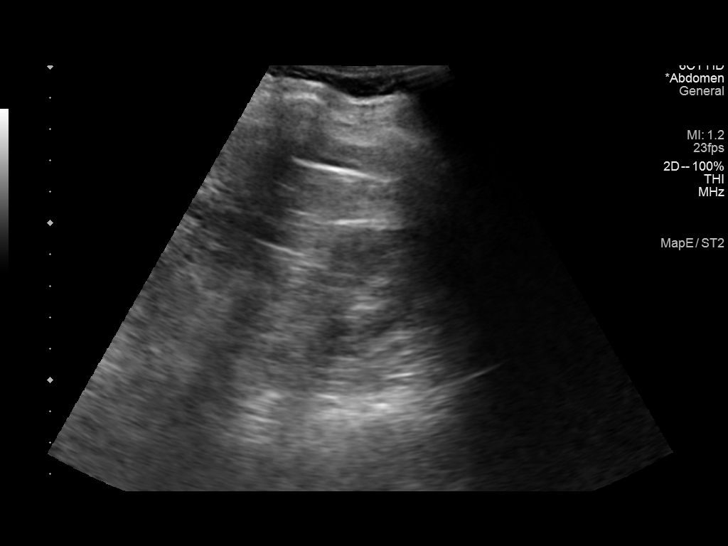
[im 83/83]
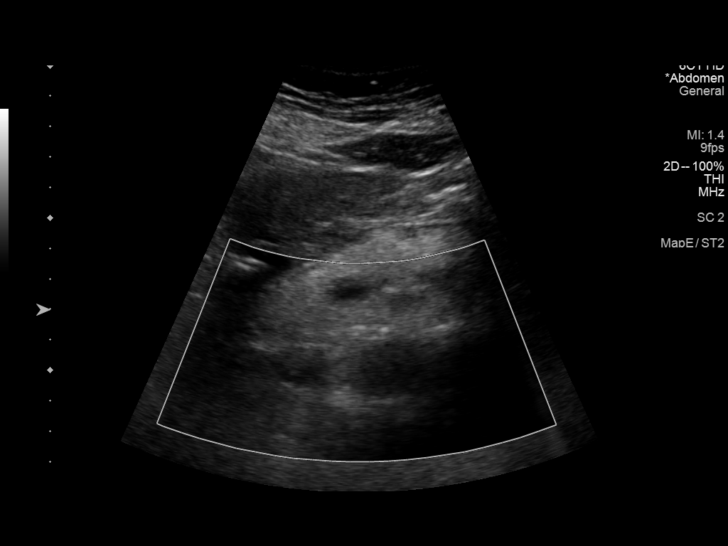

[14 of 25 positions shown; findings below may reference images not displayed]

FINDINGS: Gallbladder: No gallstones or wall thickening visualized. No
sonographic Murphy sign noted by sonographer.

Common bile duct: Diameter: 3.2 mm

Liver: Diffuse increased echogenicity. There is an 8 mm cyst in the
left hepatic lobe. Portal vein is patent on color Doppler imaging
with normal direction of blood flow towards the liver.

IVC: No abnormality visualized.

Pancreas: Visualized portion unremarkable.

Spleen: Size and appearance within normal limits.

Right Kidney: Length: 12.8 cm. Echogenicity within normal limits. No
mass or hydronephrosis visualized.

Left Kidney: Length: 11.8 cm. Echogenicity within normal limits. No
mass or hydronephrosis visualized.

Abdominal aorta: No aneurysm visualized.

Other findings: None.
IMPRESSION: 1. Probable hepatic steatosis. 8 mm cyst in the left hepatic lobe.
No other abnormalities.

## 2023-06-27 DIAGNOSIS — F900 Attention-deficit hyperactivity disorder, predominantly inattentive type: Secondary | ICD-10-CM | POA: Diagnosis not present

## 2023-06-27 DIAGNOSIS — F411 Generalized anxiety disorder: Secondary | ICD-10-CM | POA: Diagnosis not present

## 2023-06-27 DIAGNOSIS — F1011 Alcohol abuse, in remission: Secondary | ICD-10-CM | POA: Diagnosis not present

## 2023-07-02 DIAGNOSIS — F1020 Alcohol dependence, uncomplicated: Secondary | ICD-10-CM | POA: Diagnosis not present

## 2023-07-02 DIAGNOSIS — K760 Fatty (change of) liver, not elsewhere classified: Secondary | ICD-10-CM | POA: Diagnosis not present

## 2023-07-21 DIAGNOSIS — F909 Attention-deficit hyperactivity disorder, unspecified type: Secondary | ICD-10-CM | POA: Diagnosis not present

## 2023-07-30 DIAGNOSIS — F411 Generalized anxiety disorder: Secondary | ICD-10-CM | POA: Diagnosis not present

## 2023-07-30 DIAGNOSIS — F1011 Alcohol abuse, in remission: Secondary | ICD-10-CM | POA: Diagnosis not present

## 2023-07-30 DIAGNOSIS — F900 Attention-deficit hyperactivity disorder, predominantly inattentive type: Secondary | ICD-10-CM | POA: Diagnosis not present

## 2023-08-14 DIAGNOSIS — H9201 Otalgia, right ear: Secondary | ICD-10-CM | POA: Diagnosis not present

## 2023-08-27 DIAGNOSIS — F900 Attention-deficit hyperactivity disorder, predominantly inattentive type: Secondary | ICD-10-CM | POA: Diagnosis not present

## 2023-08-27 DIAGNOSIS — F1011 Alcohol abuse, in remission: Secondary | ICD-10-CM | POA: Diagnosis not present

## 2023-08-27 DIAGNOSIS — F411 Generalized anxiety disorder: Secondary | ICD-10-CM | POA: Diagnosis not present

## 2023-10-02 DIAGNOSIS — F1011 Alcohol abuse, in remission: Secondary | ICD-10-CM | POA: Diagnosis not present

## 2023-10-02 DIAGNOSIS — F900 Attention-deficit hyperactivity disorder, predominantly inattentive type: Secondary | ICD-10-CM | POA: Diagnosis not present

## 2023-10-02 DIAGNOSIS — F411 Generalized anxiety disorder: Secondary | ICD-10-CM | POA: Diagnosis not present
